# Patient Record
Sex: Female | Born: 1970 | Race: Asian | Hispanic: No | Smoking: Never smoker
Health system: Southern US, Community
[De-identification: ages and names within clinical notes are randomized; demographics above are authoritative.]

## PROBLEM LIST (undated history)

## (undated) ENCOUNTER — Ambulatory Visit: Admission: EM | Payer: No Typology Code available for payment source | Source: Home / Self Care

## (undated) ENCOUNTER — Inpatient Hospital Stay (HOSPITAL_COMMUNITY): Payer: Self-pay

## (undated) DIAGNOSIS — D649 Anemia, unspecified: Secondary | ICD-10-CM

## (undated) DIAGNOSIS — B181 Chronic viral hepatitis B without delta-agent: Secondary | ICD-10-CM

## (undated) DIAGNOSIS — E119 Type 2 diabetes mellitus without complications: Secondary | ICD-10-CM

## (undated) DIAGNOSIS — O24419 Gestational diabetes mellitus in pregnancy, unspecified control: Secondary | ICD-10-CM

## (undated) DIAGNOSIS — I10 Essential (primary) hypertension: Secondary | ICD-10-CM

## (undated) HISTORY — DX: Gestational diabetes mellitus in pregnancy, unspecified control: O24.419

## (undated) HISTORY — DX: Essential (primary) hypertension: I10

## (undated) HISTORY — PX: BRAIN SURGERY: SHX531

## (undated) HISTORY — DX: Chronic viral hepatitis B without delta-agent: B18.1

## (undated) HISTORY — DX: Type 2 diabetes mellitus without complications: E11.9

---

## 1998-03-01 ENCOUNTER — Ambulatory Visit (HOSPITAL_COMMUNITY): Admission: RE | Admit: 1998-03-01 | Discharge: 1998-03-01 | Payer: Self-pay | Admitting: Family Medicine

## 1998-03-20 ENCOUNTER — Encounter: Admission: RE | Admit: 1998-03-20 | Discharge: 1998-03-20 | Payer: Self-pay | Admitting: Sports Medicine

## 1998-04-03 ENCOUNTER — Encounter: Admission: RE | Admit: 1998-04-03 | Discharge: 1998-04-03 | Payer: Self-pay | Admitting: Family Medicine

## 1998-04-11 ENCOUNTER — Encounter: Admission: RE | Admit: 1998-04-11 | Discharge: 1998-04-11 | Payer: Self-pay | Admitting: Sports Medicine

## 1998-04-23 ENCOUNTER — Encounter: Admission: RE | Admit: 1998-04-23 | Discharge: 1998-04-23 | Payer: Self-pay | Admitting: Family Medicine

## 1998-04-30 ENCOUNTER — Encounter: Admission: RE | Admit: 1998-04-30 | Discharge: 1998-04-30 | Payer: Self-pay | Admitting: Family Medicine

## 1998-05-08 ENCOUNTER — Encounter: Admission: RE | Admit: 1998-05-08 | Discharge: 1998-05-08 | Payer: Self-pay | Admitting: Family Medicine

## 1998-05-13 ENCOUNTER — Encounter (HOSPITAL_COMMUNITY): Admission: RE | Admit: 1998-05-13 | Discharge: 1998-05-16 | Payer: Self-pay | Admitting: Obstetrics & Gynecology

## 1998-05-14 ENCOUNTER — Encounter: Admission: RE | Admit: 1998-05-14 | Discharge: 1998-05-14 | Payer: Self-pay | Admitting: Family Medicine

## 1998-05-16 ENCOUNTER — Inpatient Hospital Stay (HOSPITAL_COMMUNITY): Admission: AD | Admit: 1998-05-16 | Discharge: 1998-05-18 | Payer: Self-pay | Admitting: Obstetrics & Gynecology

## 1998-05-20 ENCOUNTER — Encounter: Admission: RE | Admit: 1998-05-20 | Discharge: 1998-05-20 | Payer: Self-pay | Admitting: Family Medicine

## 1998-06-25 ENCOUNTER — Encounter: Admission: RE | Admit: 1998-06-25 | Discharge: 1998-06-25 | Payer: Self-pay | Admitting: Family Medicine

## 1998-06-27 ENCOUNTER — Ambulatory Visit (HOSPITAL_COMMUNITY): Admission: RE | Admit: 1998-06-27 | Discharge: 1998-06-27 | Payer: Self-pay | Admitting: Obstetrics

## 1998-07-24 ENCOUNTER — Encounter: Admission: RE | Admit: 1998-07-24 | Discharge: 1998-07-24 | Payer: Self-pay | Admitting: Family Medicine

## 1998-07-24 ENCOUNTER — Other Ambulatory Visit: Admission: RE | Admit: 1998-07-24 | Discharge: 1998-07-24 | Payer: Self-pay | Admitting: Family Medicine

## 1998-08-07 ENCOUNTER — Encounter: Admission: RE | Admit: 1998-08-07 | Discharge: 1998-08-07 | Payer: Self-pay | Admitting: Family Medicine

## 1998-08-12 ENCOUNTER — Encounter: Admission: RE | Admit: 1998-08-12 | Discharge: 1998-08-12 | Payer: Self-pay | Admitting: Family Medicine

## 2001-12-01 ENCOUNTER — Ambulatory Visit (HOSPITAL_COMMUNITY): Admission: RE | Admit: 2001-12-01 | Discharge: 2001-12-01 | Payer: Self-pay | Admitting: *Deleted

## 2002-03-02 ENCOUNTER — Ambulatory Visit (HOSPITAL_COMMUNITY): Admission: RE | Admit: 2002-03-02 | Discharge: 2002-03-02 | Payer: Self-pay | Admitting: *Deleted

## 2002-03-16 ENCOUNTER — Encounter (HOSPITAL_COMMUNITY): Admission: AD | Admit: 2002-03-16 | Discharge: 2002-04-15 | Payer: Self-pay | Admitting: *Deleted

## 2002-04-28 ENCOUNTER — Inpatient Hospital Stay (HOSPITAL_COMMUNITY): Admission: AD | Admit: 2002-04-28 | Discharge: 2002-05-01 | Payer: Self-pay | Admitting: *Deleted

## 2002-04-28 ENCOUNTER — Encounter (HOSPITAL_COMMUNITY): Admission: RE | Admit: 2002-04-28 | Discharge: 2002-04-29 | Payer: Self-pay | Admitting: *Deleted

## 2003-08-02 ENCOUNTER — Other Ambulatory Visit: Admission: RE | Admit: 2003-08-02 | Discharge: 2003-08-02 | Payer: Self-pay | Admitting: Obstetrics and Gynecology

## 2004-04-21 ENCOUNTER — Other Ambulatory Visit: Admission: RE | Admit: 2004-04-21 | Discharge: 2004-04-21 | Payer: Self-pay | Admitting: Obstetrics and Gynecology

## 2005-09-16 ENCOUNTER — Other Ambulatory Visit: Admission: RE | Admit: 2005-09-16 | Discharge: 2005-09-16 | Payer: Self-pay | Admitting: Obstetrics and Gynecology

## 2005-12-05 ENCOUNTER — Emergency Department (HOSPITAL_COMMUNITY): Admission: EM | Admit: 2005-12-05 | Discharge: 2005-12-06 | Payer: Self-pay | Admitting: Emergency Medicine

## 2006-05-31 ENCOUNTER — Ambulatory Visit: Payer: Self-pay | Admitting: Internal Medicine

## 2006-06-02 ENCOUNTER — Ambulatory Visit: Payer: Self-pay | Admitting: Family Medicine

## 2009-07-17 ENCOUNTER — Inpatient Hospital Stay (HOSPITAL_COMMUNITY): Admission: AD | Admit: 2009-07-17 | Discharge: 2009-07-17 | Payer: Self-pay | Admitting: Obstetrics & Gynecology

## 2009-07-18 ENCOUNTER — Inpatient Hospital Stay (HOSPITAL_COMMUNITY): Admission: RE | Admit: 2009-07-18 | Discharge: 2009-07-18 | Payer: Self-pay | Admitting: Family Medicine

## 2009-12-25 ENCOUNTER — Emergency Department (HOSPITAL_COMMUNITY): Admission: EM | Admit: 2009-12-25 | Discharge: 2009-12-25 | Payer: Self-pay | Admitting: Emergency Medicine

## 2010-01-24 ENCOUNTER — Ambulatory Visit (HOSPITAL_COMMUNITY): Admission: RE | Admit: 2010-01-24 | Discharge: 2010-01-24 | Payer: Self-pay | Admitting: Internal Medicine

## 2010-03-31 ENCOUNTER — Encounter: Admission: RE | Admit: 2010-03-31 | Discharge: 2010-03-31 | Payer: Self-pay

## 2010-09-19 ENCOUNTER — Emergency Department (HOSPITAL_COMMUNITY): Admission: EM | Admit: 2010-09-19 | Discharge: 2010-09-19 | Payer: Self-pay | Admitting: Emergency Medicine

## 2010-10-13 ENCOUNTER — Encounter: Admission: RE | Admit: 2010-10-13 | Discharge: 2010-10-13 | Payer: Self-pay | Admitting: Obstetrics and Gynecology

## 2010-10-30 ENCOUNTER — Encounter: Admission: RE | Admit: 2010-10-30 | Discharge: 2010-10-30 | Payer: Self-pay | Admitting: Geriatric Medicine

## 2011-03-01 LAB — URINE MICROSCOPIC-ADD ON

## 2011-03-01 LAB — COMPREHENSIVE METABOLIC PANEL
ALT: 14 U/L (ref 0–35)
AST: 18 U/L (ref 0–37)
Alkaline Phosphatase: 58 U/L (ref 39–117)
CO2: 27 mEq/L (ref 19–32)
Calcium: 8.5 mg/dL (ref 8.4–10.5)
Chloride: 106 mEq/L (ref 96–112)
GFR calc Af Amer: >60 mL/min (ref >60)
GFR calc non Af Amer: >60 mL/min (ref >60)
Glucose, Bld: 137 mg/dL — ABNORMAL HIGH (ref 70–99)
Potassium: 4 mEq/L (ref 3.5–5.1)
Sodium: 137 mEq/L (ref 135–145)
Total Bilirubin: 0.2 mg/dL — ABNORMAL LOW (ref 0.3–1.2)

## 2011-03-01 LAB — DIFFERENTIAL
Basophils Absolute: 0.1 10*3/uL (ref 0.0–0.1)
Eosinophils Absolute: 0.1 10*3/uL (ref 0.0–0.7)
Eosinophils Relative: 2 % (ref 0–5)
Lymphs Abs: 1.7 10*3/uL (ref 0.7–4.0)
Monocytes Absolute: 0.4 10*3/uL (ref 0.1–1.0)
Neutrophils Relative %: 57 % (ref 43–77)

## 2011-03-01 LAB — CBC
Hemoglobin: 10.9 g/dL — ABNORMAL LOW (ref 12.0–15.0)
MCHC: 31.6 g/dL (ref 30.0–36.0)
RBC: 5.83 MIL/uL — ABNORMAL HIGH (ref 3.87–5.11)
WBC: 5.4 10*3/uL (ref 4.0–10.5)

## 2011-03-01 LAB — URINALYSIS, ROUTINE W REFLEX MICROSCOPIC
Bilirubin Urine: NEGATIVE
Ketones, ur: NEGATIVE mg/dL
Nitrite: NEGATIVE
Specific Gravity, Urine: 1.026 (ref 1.005–1.030)
Urobilinogen, UA: 0.2 mg/dL (ref 0.0–1.0)

## 2011-03-01 LAB — POCT PREGNANCY, URINE: Preg Test, Ur: NEGATIVE

## 2011-03-01 LAB — LIPASE, BLOOD: Lipase: 27 U/L (ref 11–59)

## 2011-03-21 LAB — POCT PREGNANCY, URINE: Preg Test, Ur: NEGATIVE

## 2011-05-18 ENCOUNTER — Other Ambulatory Visit (HOSPITAL_COMMUNITY): Payer: Self-pay | Admitting: Gastroenterology

## 2011-05-18 DIAGNOSIS — R1011 Right upper quadrant pain: Secondary | ICD-10-CM

## 2011-05-25 ENCOUNTER — Encounter (HOSPITAL_COMMUNITY)
Admission: RE | Admit: 2011-05-25 | Discharge: 2011-05-25 | Disposition: A | Discharge status: Home / Self Care | Payer: Medicaid Other | Source: Ambulatory Visit | Attending: Gastroenterology | Admitting: Gastroenterology

## 2011-05-25 DIAGNOSIS — R1011 Right upper quadrant pain: Secondary | ICD-10-CM

## 2011-05-25 MED ORDER — TECHNETIUM TC 99M MEBROFENIN IV KIT
5.0000 | PACK | Freq: Once | INTRAVENOUS | Status: AC | PRN
  Administered 2011-05-25: 5 via INTRAVENOUS

## 2011-06-10 ENCOUNTER — Other Ambulatory Visit: Payer: Self-pay | Admitting: Gastroenterology

## 2011-06-10 DIAGNOSIS — R1011 Right upper quadrant pain: Secondary | ICD-10-CM

## 2011-06-15 ENCOUNTER — Ambulatory Visit
Admission: RE | Admit: 2011-06-15 | Discharge: 2011-06-15 | Disposition: A | Discharge status: Home / Self Care | Payer: Medicaid Other | Source: Ambulatory Visit | Attending: Gastroenterology | Admitting: Gastroenterology

## 2011-06-15 DIAGNOSIS — R1011 Right upper quadrant pain: Secondary | ICD-10-CM

## 2011-06-15 MED ORDER — IOHEXOL 300 MG/ML  SOLN: 100.0000 mL | Freq: Once | INTRAMUSCULAR | Status: AC | PRN

## 2011-06-22 ENCOUNTER — Ambulatory Visit
Admission: RE | Admit: 2011-06-22 | Discharge: 2011-06-22 | Disposition: A | Discharge status: Home / Self Care | Payer: Medicaid Other | Source: Ambulatory Visit | Attending: Gastroenterology | Admitting: Gastroenterology

## 2011-06-22 DIAGNOSIS — R1011 Right upper quadrant pain: Secondary | ICD-10-CM

## 2011-08-26 ENCOUNTER — Emergency Department (HOSPITAL_COMMUNITY)
Admission: EM | Admit: 2011-08-26 | Discharge: 2011-08-26 | Discharge status: Against Medical Advice | Payer: Medicaid Other | Attending: Emergency Medicine | Admitting: Emergency Medicine

## 2012-10-10 ENCOUNTER — Encounter (HOSPITAL_COMMUNITY): Payer: Self-pay

## 2012-10-10 ENCOUNTER — Inpatient Hospital Stay (HOSPITAL_COMMUNITY)
Admission: AD | Admit: 2012-10-10 | Discharge: 2012-10-10 | Disposition: A | Discharge status: Home / Self Care | Payer: Medicaid Other | Source: Ambulatory Visit | Attending: Obstetrics and Gynecology | Admitting: Obstetrics and Gynecology

## 2012-10-10 ENCOUNTER — Inpatient Hospital Stay (HOSPITAL_COMMUNITY): Payer: Medicaid Other

## 2012-10-10 DIAGNOSIS — O039 Complete or unspecified spontaneous abortion without complication: Secondary | ICD-10-CM

## 2012-10-10 DIAGNOSIS — D649 Anemia, unspecified: Secondary | ICD-10-CM

## 2012-10-10 LAB — CBC
MCH: 16.9 pg — ABNORMAL LOW (ref 26.0–34.0)
MCHC: 29.8 g/dL — ABNORMAL LOW (ref 30.0–36.0)
Platelets: 159 10*3/uL (ref 150–400)
RBC: 5.86 MIL/uL — ABNORMAL HIGH (ref 3.87–5.11)

## 2012-10-10 MED ORDER — FERROUS SULFATE 325 (65 FE) MG PO TABS: 325.0000 mg | ORAL_TABLET | Freq: Two times a day (BID) | ORAL | Status: DC

## 2012-10-10 MED ORDER — OXYCODONE-ACETAMINOPHEN 5-325 MG PO TABS: 1.0000 | ORAL_TABLET | Freq: Four times a day (QID) | ORAL | Status: DC | PRN

## 2012-10-10 NOTE — MAU Note (Signed)
Patient states she ws seen at [facility] on 10-15 and states the baby did not have a heart beat. States she wanted to wait and let the miscarriage happen on its own. States on 10-25 she passed tissue and has been bleeding and having pain since 10-26 and continues to bleed and have pain.

## 2012-10-10 NOTE — MAU Provider Note (Signed)
History     CSN: [id number]  Arrival date and time: 10/10/12 1155   First Provider Initiated Contact with Patient 10/10/12 1242      Chief Complaint  Patient presents with  . Vaginal Bleeding  . Abdominal Pain   HPI [name] is a 41yo G5P5000 female with a GA of [redacted]w[redacted]d who is currently experiencing a spontaneous abortion and presents today for vaginal bleeding. She sees Dr. Jackelyn Ali for her prenatal care and was diagnosed with a spontaneous abortion last week by ultrasound evaluation. Patient wished for expectant management and did not take any medications. She reports having passed blood and "clots" on Friday. She is still bleeding, she feels it is heavy bleeding. Unable to quantify amount; she uses a "bucket" that she sits on at home to catch all the blood.  Patient complains of weakness, lightheadedness and dizziness upon standing, lower abdominal cramping, vaginal bleeding, decreased appetite, and mild occasional nausea. Denies fever, chills, vomiting, chest pain, palpitations, or shortness of breath. Patient reports RUQ abdominal pain that has been present for 2 months (during her pregnancy). She states that the abdominal pain has historically been aggravated by food but has been less painful the past 2 weeks.  Patient called Dr. Berenda Ali office early this morning and was able to get an appointment for this afternoon. Patient's daughter is driving her and has to work this afternoon, so they came to MAU rather than waiting for afternoon appointment at Dr. Berenda Ali office.   History reviewed. No pertinent past medical history.  History reviewed. No pertinent past surgical history.  History reviewed. No pertinent family history.  History  Substance Use Topics  . Smoking status: Never Smoker   . Smokeless tobacco: Not on file  . Alcohol Use:     Allergies: No Known Allergies  Prescriptions prior to admission  Medication Sig Dispense Refill  . ibuprofen  (ADVIL,MOTRIN) 200 MG tablet Take 200 mg by mouth every 6 (six) hours as needed. pain        Review of Systems  Constitutional: Positive for malaise/fatigue. Negative for fever and chills.  Eyes: Negative for blurred vision and double vision.  Respiratory: Negative for shortness of breath.   Cardiovascular: Negative for chest pain and palpitations.  Gastrointestinal: Positive for nausea and abdominal pain. Negative for vomiting, diarrhea and constipation.  Genitourinary: Negative for dysuria, urgency and frequency.  Neurological: Positive for dizziness and weakness.   Physical Exam   Blood pressure 101/67, pulse 69, temperature 98.4 F (36.9 C), temperature source Oral, resp. rate 16, height 5\' 2"  (1.575 m), weight 73.846 kg (162 lb 12.8 oz), last menstrual period 07/28/2012, SpO2 100.00%.  Physical Exam  Constitutional: She appears well-developed and well-nourished. She appears distressed (mild distress).  HENT:  Head: Normocephalic and atraumatic.  Neck: Neck supple. No thyromegaly present.  Cardiovascular: Normal rate, regular rhythm and normal heart sounds.   Respiratory: Effort normal and breath sounds normal. No respiratory distress.  GI: Soft. Bowel sounds are normal. She exhibits no distension and no mass. There is tenderness (tenderness to deep palpation in RUQ. No tenderness to light or deep palpation elsewhere). There is no rebound and no guarding.  Genitourinary: There is no tenderness or lesion on the right labia. There is no tenderness or lesion on the left labia. Uterus is tender (mildly). Uterus is not enlarged. Cervix exhibits no friability. Right adnexum displays no mass, no tenderness and no fullness. Left adnexum displays no mass, no tenderness and no fullness. There is bleeding (  mod amount bright red bleeding with 3 clots.) around the vagina.       Pelvic exam by Jeani Sow, RN FNP  Lymphadenopathy:    She has no cervical adenopathy.  Skin: Skin is warm and dry.    Results for orders placed during the hospital encounter of 10/10/12 (from the past 24 hour(s))  CBC     Status: Abnormal   Collection Time   10/10/12 12:40 PM      Component Value Range   WBC 4.5  4.0 - 10.5 K/uL   RBC 5.86 (*) 3.87 - 5.11 MIL/uL   Hemoglobin 9.9 (*) 12.0 - 15.0 g/dL   HCT 16.1 (*) 09.6 - 04.5 %   MCV 56.7 (*) 78.0 - 100.0 fL   MCH 16.9 (*) 26.0 - 34.0 pg   MCHC 29.8 (*) 30.0 - 36.0 g/dL   RDW 40.9 (*) 81.1 - 91.4 %   Platelets 159  150 - 400 K/uL  HCG, QUANTITATIVE, PREGNANCY     Status: Abnormal   Collection Time   10/10/12 12:40 PM      Component Value Range   hCG, Beta Chain, Quant, S 1537 (*) <5 mIU/mL  ABO/RH     Status: Normal (Preliminary result)   Collection Time   10/10/12 12:40 PM      Component Value Range   ABO/RH(D) A POS     Clinical Data: Recent first trimester spontaneous abortion.  Vaginal bleeding. Negative pregnancy test. Evaluate for retained  products of conception.  TRANSABDOMINAL AND TRANSVAGINAL ULTRASOUND OF PELVIS  Technique: Both transabdominal and transvaginal ultrasound  examinations of the pelvis were performed. Transabdominal  technique was performed for global imaging of the pelvis including  uterus, ovaries, adnexal regions, and pelvic cul-de-sac.  It was necessary to proceed with endovaginal exam following the  transabdominal exam to visualize the the endometrium and ovaries.  Comparison: None.  Findings:  Uterus: 10.1 x 5.6 x 6.0 cm. A heterogeneous submucosal mass is  seen at the anterior endometrial - myometrial junction which has  irregular margins and increased blood flow on color Doppler  ultrasound. This measures approximately 2.0 by 1.7 x 1.1 cm. This  does not have typical findings of a submucosal fibroid, and is  suspicious for retained products of conception.  Endometrium: Heterogeneous appearance. Double layer thickness  measures 19 mm.  Right ovary: 3.7 x 2.2 x 2.6 cm. Normal appearance.  Left ovary:  2.6 x 0.9 x 2.5 cm. Normal appearance.  Other Findings: No free fluid  IMPRESSION:  1. 2 cm hypervascular mass at the anterior endometrial -  myometrial junction in the uterine corpus. This is suspicious for  retained products of conception, with submucosal fibroid considered  less likely.  2. Normal appearance of both ovaries. No adnexal mass or free  fluid identified.  Original Report Authenticated By: Danae Orleans, M.D.    MAU Course  Procedures  MDM 14:50  Reported Labs, Ultrasound and Physical exam findings to Dr. Senaida Ores.  Instructed to talk to patient re: treatment options and let her know what she wants to do.   I discussed with the patient--explained expected management, to use Cytotec that she has Rx for at home, or D&E that could be done today.  The patient, through the interpreter, wants to let the miscarriage continue to happen without intervention.  Discussed a second time and patient wants the same.  She understands to call the office if sxs worsen or if she changes her mind about treatment.  She is to call and make appt for 1 week.  Dr. Senaida Ores informed of patient's discussion  ABORH not found in office or here will draw before she leaves.  Patient, may be discharged per Dr. Senaida Ores   Patient is asking for something for home for the pain.  Ibuprofen not alleviating the cramps.  Assessment and Plan  A:  Spontaneous Abortion      Anemia P:  Report worsening sxs to her doctor      To take Rx for  ferrous sulfate tablet bid daily until see in office      Follow up in the office in 1 week instructed to call office and schedule appt for repeat lab work     Rx for Percocet 5/325mg  tabs #20 Matt Holmes 10/10/2012, 3:59 PM   I have reviewed the students PHI and agree with her findings--patient was originally assigned to [employer], which is why the student began care of patient.

## 2012-10-11 LAB — GC/CHLAMYDIA PROBE AMP, GENITAL
Chlamydia, DNA Probe: NEGATIVE
GC Probe Amp, Genital: NEGATIVE

## 2013-04-25 ENCOUNTER — Emergency Department (HOSPITAL_COMMUNITY): Payer: Medicaid Other

## 2013-04-25 ENCOUNTER — Emergency Department (HOSPITAL_COMMUNITY)
Admission: EM | Admit: 2013-04-25 | Discharge: 2013-04-25 | Disposition: A | Discharge status: Home / Self Care | Payer: Medicaid Other | Attending: Emergency Medicine | Admitting: Emergency Medicine

## 2013-04-25 ENCOUNTER — Encounter (HOSPITAL_COMMUNITY): Payer: Self-pay | Admitting: *Deleted

## 2013-04-25 DIAGNOSIS — Y929 Unspecified place or not applicable: Secondary | ICD-10-CM | POA: Insufficient documentation

## 2013-04-25 DIAGNOSIS — M62838 Other muscle spasm: Secondary | ICD-10-CM

## 2013-04-25 DIAGNOSIS — X58XXXA Exposure to other specified factors, initial encounter: Secondary | ICD-10-CM | POA: Insufficient documentation

## 2013-04-25 DIAGNOSIS — Z79899 Other long term (current) drug therapy: Secondary | ICD-10-CM | POA: Insufficient documentation

## 2013-04-25 DIAGNOSIS — S161XXA Strain of muscle, fascia and tendon at neck level, initial encounter: Secondary | ICD-10-CM

## 2013-04-25 DIAGNOSIS — Y939 Activity, unspecified: Secondary | ICD-10-CM | POA: Insufficient documentation

## 2013-04-25 DIAGNOSIS — S139XXA Sprain of joints and ligaments of unspecified parts of neck, initial encounter: Secondary | ICD-10-CM | POA: Insufficient documentation

## 2013-04-25 MED ORDER — CYCLOBENZAPRINE HCL 10 MG PO TABS: 10.0000 mg | ORAL_TABLET | Freq: Two times a day (BID) | ORAL | Status: DC | PRN

## 2013-04-25 MED ORDER — KETOROLAC TROMETHAMINE 30 MG/ML IJ SOLN: 30.0000 mg | Freq: Once | INTRAMUSCULAR | Status: DC

## 2013-04-25 MED ORDER — ORPHENADRINE CITRATE 30 MG/ML IJ SOLN: 60.0000 mg | Freq: Two times a day (BID) | INTRAMUSCULAR | Status: DC

## 2013-04-25 MED ORDER — DIPHENHYDRAMINE HCL 25 MG PO CAPS: 25.0000 mg | ORAL_CAPSULE | Freq: Once | ORAL | Status: DC

## 2013-04-25 MED ORDER — KETOROLAC TROMETHAMINE 30 MG/ML IJ SOLN
30.0000 mg | Freq: Once | INTRAMUSCULAR | Status: AC
  Administered 2013-04-25: 30 mg via INTRAMUSCULAR
  Filled 2013-04-25: qty 1

## 2013-04-25 MED ORDER — KETOROLAC TROMETHAMINE 15 MG/ML IJ SOLN
30.0000 mg | Freq: Once | INTRAMUSCULAR | Status: DC
  Filled 2013-04-25: qty 2

## 2013-04-25 MED ORDER — IBUPROFEN 400 MG PO TABS: 400.0000 mg | ORAL_TABLET | Freq: Four times a day (QID) | ORAL | Status: DC | PRN

## 2013-04-25 MED ORDER — METHOCARBAMOL 100 MG/ML IJ SOLN
500.0000 mg | Freq: Once | INTRAMUSCULAR | Status: DC
  Filled 2013-04-25: qty 5

## 2013-04-25 MED ORDER — CYCLOBENZAPRINE HCL 10 MG PO TABS
5.0000 mg | ORAL_TABLET | Freq: Once | ORAL | Status: AC
  Administered 2013-04-25: 5 mg via ORAL
  Filled 2013-04-25: qty 1

## 2013-04-25 NOTE — ED Provider Notes (Signed)
History     CSN: [id number]  Arrival date & time 04/25/13  2725   First MD Initiated Contact with Patient 04/25/13 [phone]      Chief Complaint  Patient presents with  . Neck Pain    (Consider location/radiation/quality/duration/timing/severity/associated sxs/prior treatment) HPI Comments: 42 y.o. female who presents to the Er w/ the cc of neck pain. Neck pain started more than 3 months ago. No trauma to the neck. She has left sided neck pain that radiates up towards her head, it is not global in nature. Waxes and wanes. No fevers or chills or increased masses associated w/ this. No n/v associated w/ this. She is been given fioricet for this without resolution of symptoms. Has not tried NSAIDS or muscle relaxants.   Patient is a 42 y.o. female presenting with neck pain. The history is provided by the patient.  Neck Pain Pain location: left sided. Quality:  Cramping Radiates to: radiates to the back of her head  Pain severity:  Mild Pain is:  Worse during the day Onset quality:  Gradual Duration:  3 months Timing:  Constant Progression:  Unchanged Context: not fall   Relieved by:  Nothing Exacerbated by: tried fioricet. Associated symptoms: no chest pain, no fever, no headaches and no leg pain     History reviewed. No pertinent past medical history.  History reviewed. No pertinent past surgical history.  No family history on file.  History  Substance Use Topics  . Smoking status: Never Smoker   . Smokeless tobacco: Not on file  . Alcohol Use:     OB History   Grav Para Term Preterm Abortions TAB SAB Ect Mult Living   6 5 5              Review of Systems  Constitutional: Negative for fever, chills and fatigue.  HENT: Positive for neck pain. Negative for facial swelling, drooling and dental problem.   Eyes: Negative for pain, discharge and itching.  Respiratory: Negative for cough, choking, wheezing and stridor.   Cardiovascular: Negative for chest pain.   Gastrointestinal: Negative for vomiting, abdominal pain and diarrhea.  Endocrine: Negative for cold intolerance and heat intolerance.  Genitourinary: Negative for vaginal discharge, difficulty urinating and vaginal pain.  Skin: Negative for pallor and rash.  Neurological: Negative for dizziness, light-headedness and headaches.  Psychiatric/Behavioral: Negative for behavioral problems and agitation.    Allergies  Review of patient's allergies indicates no known allergies.  Home Medications   Current Outpatient Rx  Name  Route  Sig  Dispense  Refill  . ferrous sulfate (FERROUSUL) 325 (65 FE) MG tablet   Oral   Take 1 tablet (325 mg total) by mouth 2 (two) times daily.   60 tablet   3   . ibuprofen (ADVIL,MOTRIN) 200 MG tablet   Oral   Take 200 mg by mouth every 6 (six) hours as needed. pain         . oxyCODONE-acetaminophen (PERCOCET/ROXICET) 5-325 MG per tablet   Oral   Take 1-2 tablets by mouth every 6 (six) hours as needed for pain.   20 tablet   0     LMP 07/14/2012  Breastfeeding? Unknown  Physical Exam  Constitutional: She is oriented to person, place, and time. She appears well-developed. No distress.  HENT:  Head: Normocephalic and atraumatic.  Mouth/Throat: Oropharynx is clear and moist. No oropharyngeal exudate.  Left neck paraspinal ttp. Her upper trapezius muscle has ttp. No increased mass or swelling in neck. Able  to flex and extend neck without issues. Able to move neck right to left completely.   Eyes: Pupils are equal, round, and reactive to light. Right eye exhibits no discharge. Left eye exhibits no discharge.  Neck: Neck supple. No tracheal deviation present.  Cardiovascular: Normal rate.  Exam reveals no gallop and no friction rub.   Pulmonary/Chest: No stridor. No respiratory distress. She has no wheezes.  Abdominal: Soft. She exhibits no distension. There is no tenderness. There is no rebound.  Musculoskeletal: She exhibits no edema and no  tenderness.  Neurological: She is alert and oriented to person, place, and time. No cranial nerve deficit.  Pt is able to easily ambulate in the room. Heel to toe is intact. Normal gait.   Skin: Skin is warm. She is not diaphoretic.    ED Course  Procedures (including critical care time)  Labs Reviewed - No data to display Dg Cervical Spine 2-3 Views  04/25/2013  *RADIOLOGY REPORT*  Clinical Data: Posterior and left side neck pain.  CERVICAL SPINE - 2-3 VIEW  Comparison: Plain film cervical spine 01/24/2010 and MRI cervical spine 03/31/2010.  Findings: Vertebral body height and alignment are normal. Intervertebral disc space height is maintained.  Prevertebral soft tissues appear normal.  Lung apices are clear.  IMPRESSION: Negative examination.   Original Report Authenticated By: Holley Dexter, M.D.       MDM   Pt has no headache -- do not suspect SAH / migraines, or other acute intracranial pathology. Pt's symptoms are consistent w/ left sided neck muscle strain / spasm. She is not febrile -- doubt infectious etiology. xrays do not show acute pathology. No signs of meningitis -- no nuchal rigidity, no fevers -- symptoms present now for over 3 months.   Has not been tried on NSAIDs or muscle relaxants -- will try these two, and have told pt to f/u with pcp for re-evaluation, which she states she can do.    1. Neck muscle spasm   2. Neck strain, initial encounter            Bernadene Person, MD 04/25/13 1127

## 2013-04-25 NOTE — ED Provider Notes (Signed)
I have supervised the resident on the management of this patient and agree with the note above. I personally interviewed and examined the patient and my addendum is below.   Tammy Ali is a 42 y.o. female here with several months of L neck pain. Worse with movement. Tried fioricet without improvement. On exam, + paracervical tenderness, no midline tenderness. No falls or trauma. Patient wants imaging so xray done that was nl. Neuro exam unremarkable. Will d/c home on motrin and flexeril.    Richardean Canal, MD 04/25/13 1153

## 2013-04-25 NOTE — ED Notes (Signed)
Pt reports neck pain x 2-3 months. States pain worse with movement. Denies known injury. Given pain medication before but states it did not help. Pain radiates from neck into head.

## 2013-08-02 ENCOUNTER — Other Ambulatory Visit (HOSPITAL_COMMUNITY): Payer: Self-pay | Admitting: Urology

## 2013-08-02 DIAGNOSIS — R3129 Other microscopic hematuria: Secondary | ICD-10-CM

## 2013-08-07 ENCOUNTER — Ambulatory Visit (HOSPITAL_COMMUNITY): Payer: Self-pay

## 2014-10-15 ENCOUNTER — Encounter (HOSPITAL_COMMUNITY): Payer: Self-pay | Admitting: *Deleted

## 2015-09-26 ENCOUNTER — Encounter (HOSPITAL_COMMUNITY): Payer: Self-pay | Admitting: General Practice

## 2015-09-26 ENCOUNTER — Emergency Department (HOSPITAL_COMMUNITY)
Admission: EM | Admit: 2015-09-26 | Discharge: 2015-09-26 | Disposition: A | Discharge status: Home / Self Care | Payer: BLUE CROSS/BLUE SHIELD | Attending: Emergency Medicine | Admitting: Emergency Medicine

## 2015-09-26 DIAGNOSIS — Z3202 Encounter for pregnancy test, result negative: Secondary | ICD-10-CM | POA: Insufficient documentation

## 2015-09-26 DIAGNOSIS — Z862 Personal history of diseases of the blood and blood-forming organs and certain disorders involving the immune mechanism: Secondary | ICD-10-CM | POA: Diagnosis not present

## 2015-09-26 DIAGNOSIS — Z79899 Other long term (current) drug therapy: Secondary | ICD-10-CM | POA: Diagnosis not present

## 2015-09-26 DIAGNOSIS — R51 Headache: Secondary | ICD-10-CM | POA: Diagnosis present

## 2015-09-26 DIAGNOSIS — G43809 Other migraine, not intractable, without status migrainosus: Secondary | ICD-10-CM | POA: Diagnosis not present

## 2015-09-26 HISTORY — DX: Anemia, unspecified: D64.9

## 2015-09-26 LAB — POC URINE PREG, ED: Preg Test, Ur: NEGATIVE

## 2015-09-26 MED ORDER — KETOROLAC TROMETHAMINE 30 MG/ML IJ SOLN
30.0000 mg | Freq: Once | INTRAMUSCULAR | Status: AC
  Administered 2015-09-26: 30 mg via INTRAVENOUS
  Filled 2015-09-26: qty 1

## 2015-09-26 MED ORDER — MAGNESIUM SULFATE 2 GM/50ML IV SOLN
2.0000 g | Freq: Once | INTRAVENOUS | Status: AC
  Administered 2015-09-26: 2 g via INTRAVENOUS
  Filled 2015-09-26: qty 50

## 2015-09-26 MED ORDER — METOCLOPRAMIDE HCL 5 MG/ML IJ SOLN
10.0000 mg | Freq: Once | INTRAMUSCULAR | Status: AC
  Administered 2015-09-26: 10 mg via INTRAVENOUS
  Filled 2015-09-26: qty 2

## 2015-09-26 MED ORDER — DIPHENHYDRAMINE HCL 50 MG/ML IJ SOLN
25.0000 mg | Freq: Once | INTRAMUSCULAR | Status: AC
  Administered 2015-09-26: 25 mg via INTRAVENOUS
  Filled 2015-09-26: qty 1

## 2015-09-26 NOTE — Discharge Instructions (Signed)
Follow-up with your doctor as needed. Return to ED for worsening symptoms. ?au n?a ??u. (Migraine Headache) ?au n?a ??u l m?t c?n ?au nhi v nhi?u ? m?t bn ??u c?a qu v?. M?t c?n ?au n?a ??u c th? ko di t? 30 pht ??n vi ti?ng. NGUYN NHN.  Nguyn nhn chnh xc c?a ?au n?a ??u khng ph?i lc no c?ng xc ??nh ???c. Tuy nhin, ?au n?a ??u c th? pht sinh khi cc dy th?n kinh trong no b? kch thch v gi?i phng ra cc ha ch?t gy vim. Hi?n t??ng ny gy ra ?au. M?t s? v?n ?? khc c?ng c th? gy ra ?au n?a ??u, ch?ng h?n:  R??u.  Ht thu?c l.  C?ng th?ng.  Kinh nguy?t.  Pho mt ?? lu.  Th?c ?n ho?c ?? u?ng c ch?a nitrat, glutamate, aspartame, ho?c tyramine.  Thi?u ng?.  S c la.  Caffeine.  ?i.  G?ng s?c.  M?t m?i.  Thu?c dng ?? ?i?u tr? ?au ng?c (nitroglycerine), vin thu?c trnh [place], estrogen, v m?t s? thu?c ?i?u tr? huy?t p. D?U HI?U V TRI?U CH?NG  ?au ? m?t bn ho?c c? hai bn ??u.  ?au t?ng c?n ho?c ?au nhi.  ?au nhi?u lm c?n tr? cc ho?t ??ng hng ngy.  C?n ?au k?ch pht khi c b?t k? ho?t ??ng th? ch?t no.  Bu?n nn, nn m?a, ho?c c? hai.  Chng m?t.  ?au khi ti?p xc v?i nh sng chi, ti?ng ?n l?n, ho?c ho?t ??ng.  Nh?y c?m ton thn v?i nh sng chi, ti?ng ?n l?n, ho?c mi. Tr??c khi qu v? b? ?au n?a ??u, qu v? c th? c nh?ng d?u hi?u c?nh bo s?p c c?n ?au n?a ??u (ti?n tri?u). M?t ti?n tri?u c th? bao g?m:  Nhn th?y nh sng lo ln.  Nhn th?y nh?ng ?i?m sng, qu?ng sng, ho?c cc ???ng ngo?n ngoo.  C th? tr??ng hnh ?ng ho?c nhn m?.  C c?m gic t b ho?c ?au bu?t.  Ni kh.  B? y?u c?. CH?N ?ON  ?au n?a ??u th??ng ???c ch?n ?on d?a vo:  Cc tri?u ch?ng.  Khm th?c th?.  Ch?p CT ho?c MRI ??u qu v?. Cc ki?m tra b?ng hnh ?nh ny khng th? ch?n ?on ???c ?au n?a ??u, nh?ng chng c th? gip lo?i tr? nh?ng nguyn nhn gy ?au ??u khc. ?I?U TR? C th? cho dng thu?c gi?m ?au v ch?ng bu?n  nn. C?ng c th? cho dng thu?c ?? ng?n ng?a ti di?n ?au n?a ??u.  H??NG D?N CH?M [place] T?I NH  Ch? s? d?ng thu?c khng c?n k ??n ho?c thu?c c?n k ??n ?? gi?m ?au ho?c gi?m c?m gic kh ch?u theo ch? d?n c?a chuyn gia ch?m [place] s?c kh?e c?a qu v?. Khng nn dng thu?c m gy nghi?n ko di.  N?m trong m?t phng t?i, yn t?nh khi qu v? b? ?au n?a ??u.  Ghi nh?t k hng ngy ?? tm ra ?i?u g c th? gy cc c?n ?au n?a ??u. Ch?ng h?n, hy ghi ra:  Qu v? ?n v u?ng g.  Qu v? ? ng? bao lu.  B?t k? thay ??i no trong ch? ?? ?n ho?c thu?c men.  H?n ch? s? d?ng r??u.  B? thu?c l, n?u qu v? ht thu?c.  Ng? 7 - 9 ti?ng, ho?c theo khuy?n ngh? c?a chuyn gia ch?m [place] s?c kh?e.  H?n ch? c?ng th?ng.  Gi? cho nh sng d?u nh? n?u nh  sng m?nh lm qu v? kh ch?u v lm ch?ng ?au n?a ??u t?i t? h?n. NGAY L?P T?C ?I KHM N?U:   C?n ?au n?a ??u c?a qu v? n?ng h?n.  Qu v? b? s?t.  Qu v? b? c?ng c?.  Qu v? b? m?t th? l?c.  Qu v? b? y?u c? ho?c m?t ki?m sot c?.  Qu v? b?t ??u m?t th?ng b?ng ho?c ?i l?i kh kh?n.  Qu v? c?m th?y mu?n ng?t ho?c ng?t.  Qu v? c nh?ng tri?u ch?ng n?ng khc v?i nh?ng tri?u ch?ng ban ??u. ??M B?O QU V?:   Hi?u r cc h??ng d?n ny.  S? theo di tnh tr?ng c?a mnh.  S? yu c?u tr? gip ngay l?p t?c n?u qu v? c?m th?y khng kh?e ho?c th?y tr?m tr?ng h?n.   Thng tin ny khng nh?m m?c ?ch thay th? cho l?i khuyn m chuyn gia ch?m [place] s?c kh?e ni v?i qu v?. Hy b?o ??m qu v? ph?i th?o lu?n b?t k? v?n ?? g m qu v? c v?i chuyn gia ch?m [place] s?c kh?e c?a qu v?.   Document Released: 11/30/2005 Document Revised: 09/20/2013 Elsevier Interactive Patient Education [employer]

## 2015-09-26 NOTE — ED Notes (Signed)
Pt presents to the ED with complaints of an intermittent migraine for 2 weeks. Pt reports pain is worse at night. Migraine is located on pts left lateral head. Pt is A/O. Pt denies vomiting, but reports feeling nauseated. Pain is worse with movement. Pt describes pain as a throbbing pounding pain.

## 2015-09-26 NOTE — ED Provider Notes (Signed)
CSN: [id number]     Arrival date & time 09/26/15  [id number] History   First MD Initiated Contact with Patient 09/26/15 0751     Chief Complaint  Patient presents with  . Migraine     (Consider location/radiation/quality/duration/timing/severity/associated sxs/prior Treatment) HPI Tammy Ali is a 44 y.o. female with a history of migraines comes in for evaluation of acute migraine. There is a liquid barrier, daughter is at bedside and translates. For the past 2 weeks, patient has had intermittent migraines, similar to her previous migraines. She has been taking ibuprofen without relief. She denies any fevers, chills, nausea or vomiting, neck pain or stiffness, rash. She reports a throbbing sensation to the left side of her head. Rates her discomfort as a 7/10. No recent travel or surgeries. Pain is worse with moving.  Past Medical History  Diagnosis Date  . Anemia    History reviewed. No pertinent past surgical history. No family history on file. Social History  Substance Use Topics  . Smoking status: Never Smoker   . Smokeless tobacco: None  . Alcohol Use: No   OB History    Gravida Para Term Preterm AB TAB SAB Ectopic Multiple Living   6 5 5             Review of Systems A 10 point review of systems was completed and was negative except for pertinent positives and negatives as mentioned in the history of present illness     Allergies  Review of patient's allergies indicates no known allergies.  Home Medications   Prior to Admission medications   Medication Sig Start Date End Date Taking? Authorizing Provider  butalbital-acetaminophen-caffeine (FIORICET WITH CODEINE) 50-325-40-30 MG per capsule Take 1 capsule by mouth every 4 (four) hours as needed for headache.    Historical Provider, MD  cyclobenzaprine (FLEXERIL) 10 MG tablet Take 1 tablet (10 mg total) by mouth 2 (two) times daily as needed for muscle spasms. Patient not taking: Reported on 09/26/2015 04/25/13   Iltifat Husain,  MD  ibuprofen (ADVIL,MOTRIN) 400 MG tablet Take 1 tablet (400 mg total) by mouth every 6 (six) hours as needed for pain. Patient not taking: Reported on 09/26/2015 04/25/13   Iltifat Husain, MD   BP 102/74 mmHg  Pulse 55  Temp(Src) 98 F (36.7 C) (Oral)  Resp 18  Ht 5\' 3"  (1.6 m)  Wt 145 lb (65.772 kg)  BMI 25.69 kg/m2  SpO2 100%  LMP 09/01/2015 (Exact Date) Physical Exam  Constitutional: She is oriented to person, place, and time. She appears well-developed and well-nourished.  HENT:  Head: Normocephalic and atraumatic.  Mouth/Throat: Oropharynx is clear and moist.  Eyes: Conjunctivae and EOM are normal. Pupils are equal, round, and reactive to light. Right eye exhibits no discharge. Left eye exhibits no discharge. No scleral icterus.  No nystagmus  Neck: Neck supple.  No meningismus or nuchal rigidity.  Cardiovascular: Normal rate, regular rhythm and normal heart sounds.   Pulmonary/Chest: Effort normal and breath sounds normal. No respiratory distress. She has no wheezes. She has no rales.  Abdominal: Soft. There is no tenderness.  Musculoskeletal: Normal range of motion. She exhibits no tenderness.  Neurological: She is alert and oriented to person, place, and time.  Cranial Nerves II-XII grossly intact. Motor strength is 5/5 in all 4 extremities. Sensation intact to light touch. Gait is baseline  Skin: Skin is warm and dry. No rash noted.  Psychiatric: She has a normal mood and affect.  Nursing note and vitals reviewed.  ED Course  Procedures (including critical care time) Labs Review Labs Reviewed  POC URINE PREG, ED    Imaging Review No results found. I have personally reviewed and evaluated these images and lab results as part of my medical decision-making.   EKG Interpretation None     Meds given in ED:  Medications  ketorolac (TORADOL) 30 MG/ML injection 30 mg (30 mg Intravenous Given 09/26/15 0853)  metoCLOPramide (REGLAN) injection 10 mg (10 mg  Intravenous Given 09/26/15 0852)  diphenhydrAMINE (BENADRYL) injection 25 mg (25 mg Intravenous Given 09/26/15 0850)  magnesium sulfate IVPB 2 g 50 mL (2 g Intravenous New Bag/Given 09/26/15 0854)    New Prescriptions   No medications on file   Filed Vitals:   09/26/15 0803 09/26/15 0822 09/26/15 0900 09/26/15 1000  BP: 124/67 120/73 120/79 102/74  Pulse: 72 50 53 55  Temp: 98.1 F (36.7 C) 98 F (36.7 C)    TempSrc: Oral Oral    Resp: 16 18    Height:   (1.6 m)    Weight:  145 lb (65.772 kg)    SpO2: 100% 93% 100% 100%    MDM  Vitals stable, afebrile Feels better after analgesia in ED. Normal neurological exam. No dizziness. Gait baseline. HA gradual in onset, progressively worsening. Similar to previous HA No hx of traumatic injury No hx of clotting disorder, peripartum or postpartum state, Immunocompromise Doubt Meningitis, CVA/SAH/ICH, Dural Venous Thrombosis, Eclampsia, Seizure, Central Lesion or Tumor, Giant Cell Arteritis, Cervical/Carotid or other vascular dissection. I have personally reviewed all labs, imaging, nursing/prvious notes during the patient's evaluation in the ED today. No evidence of other acute or emergent pathology that requires immediate intervention at this time. Pt stable, in good condition and is appropriate for discharge. DC with instructions to follow up with PCP within 48 hrs for further evaluation and management of symptoms.   Final diagnoses:  Other migraine without status migrainosus, not intractable       Joycie Peek, PA-C 09/26/15 1249  Donnetta Hutching, MD 09/26/15 1329

## 2015-11-06 LAB — OB RESULTS CONSOLE ABO/RH: RH Type: POSITIVE

## 2015-11-06 LAB — OB RESULTS CONSOLE RPR: RPR: NONREACTIVE

## 2015-11-06 LAB — OB RESULTS CONSOLE HEPATITIS B SURFACE ANTIGEN: Hepatitis B Surface Ag: POSITIVE

## 2015-11-06 LAB — OB RESULTS CONSOLE GC/CHLAMYDIA
CHLAMYDIA, DNA PROBE: NEGATIVE
Gonorrhea: NEGATIVE

## 2015-11-06 LAB — OB RESULTS CONSOLE RUBELLA ANTIBODY, IGM: RUBELLA: IMMUNE

## 2015-11-06 LAB — OB RESULTS CONSOLE HIV ANTIBODY (ROUTINE TESTING): HIV: NONREACTIVE

## 2015-11-06 LAB — OB RESULTS CONSOLE ANTIBODY SCREEN: ANTIBODY SCREEN: NEGATIVE

## 2015-12-15 NOTE — L&D Delivery Note (Signed)
Operative Delivery Note At 2:50 PM a viable female was delivered via Vaginal, Spontaneous Delivery.  Presentation: vertex; Position: Occiput,, Anterior; Station: +2.  Verbal consent: obtained from patient.  Risks and benefits discussed in detail.  Risks include, but are not limited to the risks of anesthesia, bleeding, infection, damage to maternal tissues, fetal cephalhematoma.  There is also the risk of inability to effect vaginal delivery of the head, or shoulder dystocia that cannot be resolved by established maneuvers, leading to the need for emergency cesarean section.  Offered vacuum assistance due to poor pushing effort and reduced FHR variability.  APGAR: 8, 9; weight pending.   Placenta status: spontaneous, intact.   Cord: 3 vessels with the following complications: None.  Anesthesia: None  Instruments: Kiwi Episiotomy: None Lacerations: 1st degree Suture Repair: none Est. Blood Loss (mL): 300  Mom to postpartum.  Baby to Couplet care / Skin to Skin.  Jaycub Noorani D 06/13/2016, 3:01 PM

## 2016-01-27 ENCOUNTER — Other Ambulatory Visit (HOSPITAL_COMMUNITY): Payer: Self-pay | Admitting: Obstetrics and Gynecology

## 2016-01-28 ENCOUNTER — Other Ambulatory Visit (HOSPITAL_COMMUNITY): Payer: Self-pay | Admitting: Obstetrics and Gynecology

## 2016-01-28 DIAGNOSIS — R1011 Right upper quadrant pain: Secondary | ICD-10-CM

## 2016-01-28 DIAGNOSIS — B181 Chronic viral hepatitis B without delta-agent: Secondary | ICD-10-CM

## 2016-02-04 ENCOUNTER — Ambulatory Visit (HOSPITAL_COMMUNITY)
Admission: RE | Admit: 2016-02-04 | Discharge: 2016-02-04 | Disposition: A | Discharge status: Home / Self Care | Payer: BLUE CROSS/BLUE SHIELD | Source: Ambulatory Visit | Attending: Obstetrics and Gynecology | Admitting: Obstetrics and Gynecology

## 2016-02-04 DIAGNOSIS — R1011 Right upper quadrant pain: Secondary | ICD-10-CM | POA: Insufficient documentation

## 2016-02-04 DIAGNOSIS — O26892 Other specified pregnancy related conditions, second trimester: Secondary | ICD-10-CM | POA: Insufficient documentation

## 2016-02-04 DIAGNOSIS — Z3A23 23 weeks gestation of pregnancy: Secondary | ICD-10-CM | POA: Diagnosis not present

## 2016-02-04 DIAGNOSIS — O98412 Viral hepatitis complicating pregnancy, second trimester: Secondary | ICD-10-CM | POA: Diagnosis not present

## 2016-02-04 DIAGNOSIS — B181 Chronic viral hepatitis B without delta-agent: Secondary | ICD-10-CM | POA: Insufficient documentation

## 2016-03-23 DIAGNOSIS — B181 Chronic viral hepatitis B without delta-agent: Secondary | ICD-10-CM | POA: Insufficient documentation

## 2016-04-07 ENCOUNTER — Encounter: Payer: Medicaid Other | Attending: Obstetrics and Gynecology | Admitting: *Deleted

## 2016-04-07 VITALS — Ht 62.5 in | Wt 175.9 lb

## 2016-04-07 DIAGNOSIS — Z029 Encounter for administrative examinations, unspecified: Secondary | ICD-10-CM | POA: Diagnosis present

## 2016-04-07 DIAGNOSIS — R7309 Other abnormal glucose: Secondary | ICD-10-CM

## 2016-04-17 ENCOUNTER — Encounter: Payer: Self-pay | Admitting: *Deleted

## 2016-04-17 NOTE — Progress Notes (Signed)
  Patient was seen on 04/07/16 for Gestational Diabetes self-management visit at the [facility]. The following learning objectives were met by the patient during this course:   States the definition of Gestational Diabetes  States why dietary management is important in controlling blood glucose  Describes the effects each nutrient has on blood glucose levels  Demonstrates ability to create a balanced meal plan  Demonstrates carbohydrate counting   States when to check blood glucose levels  Demonstrates proper blood glucose monitoring techniques  States the effect of stress and exercise on blood glucose levels  States the importance of limiting caffeine and abstaining from alcohol and smoking  Blood glucose monitor given: Accu Chek Nano Lot # [id number] Exp: 10/13/2016  Patient instructed to monitor glucose levels: FBS: 60 - <90 2 hour: <120  *Patient received handouts:  Nutrition Diabetes and Pregnancy  Carbohydrate Counting List  Patient will be seen for follow-up as needed.

## 2016-04-21 ENCOUNTER — Ambulatory Visit: Payer: BLUE CROSS/BLUE SHIELD | Admitting: Nutrition

## 2016-05-13 LAB — OB RESULTS CONSOLE GBS: GBS: NEGATIVE

## 2016-06-10 ENCOUNTER — Telehealth (HOSPITAL_COMMUNITY): Payer: Self-pay | Admitting: General Practice

## 2016-06-10 ENCOUNTER — Encounter (HOSPITAL_COMMUNITY): Payer: Self-pay | Admitting: General Practice

## 2016-06-10 NOTE — Telephone Encounter (Signed)
Preadmission screen  

## 2016-06-13 ENCOUNTER — Inpatient Hospital Stay (HOSPITAL_COMMUNITY)
Admission: AD | Admit: 2016-06-13 | Discharge: 2016-06-15 | DRG: 774 | Disposition: A | Discharge status: Home / Self Care | Payer: Medicaid Other | Source: Ambulatory Visit | Attending: Obstetrics and Gynecology | Admitting: Obstetrics and Gynecology

## 2016-06-13 ENCOUNTER — Encounter (HOSPITAL_COMMUNITY): Payer: Self-pay

## 2016-06-13 DIAGNOSIS — B181 Chronic viral hepatitis B without delta-agent: Secondary | ICD-10-CM | POA: Diagnosis present

## 2016-06-13 DIAGNOSIS — O24420 Gestational diabetes mellitus in childbirth, diet controlled: Secondary | ICD-10-CM | POA: Diagnosis present

## 2016-06-13 DIAGNOSIS — O9842 Viral hepatitis complicating childbirth: Secondary | ICD-10-CM | POA: Diagnosis present

## 2016-06-13 DIAGNOSIS — Z3A39 39 weeks gestation of pregnancy: Secondary | ICD-10-CM | POA: Diagnosis not present

## 2016-06-13 DIAGNOSIS — IMO0001 Reserved for inherently not codable concepts without codable children: Secondary | ICD-10-CM

## 2016-06-13 LAB — CBC
HCT: 35.7 % — ABNORMAL LOW (ref 36.0–46.0)
Hemoglobin: 11.3 g/dL — ABNORMAL LOW (ref 12.0–15.0)
MCH: 18.2 pg — AB (ref 26.0–34.0)
MCHC: 31.7 g/dL (ref 30.0–36.0)
MCV: 57.4 fL — ABNORMAL LOW (ref 78.0–100.0)
Platelets: 160 10*3/uL (ref 150–400)
RBC: 6.22 MIL/uL — ABNORMAL HIGH (ref 3.87–5.11)
RDW: 19.8 % — AB (ref 11.5–15.5)
WBC: 17.1 10*3/uL — ABNORMAL HIGH (ref 4.0–10.5)

## 2016-06-13 LAB — GLUCOSE, CAPILLARY
GLUCOSE-CAPILLARY: 88 mg/dL (ref 65–99)
Glucose-Capillary: 79 mg/dL (ref 65–99)
Glucose-Capillary: 83 mg/dL (ref 65–99)

## 2016-06-13 LAB — TYPE AND SCREEN
ABO/RH(D): A POS
ANTIBODY SCREEN: NEGATIVE

## 2016-06-13 MED ORDER — SENNOSIDES-DOCUSATE SODIUM 8.6-50 MG PO TABS
2.0000 | ORAL_TABLET | ORAL | Status: DC
  Administered 2016-06-13 – 2016-06-14 (×2): 2 via ORAL
  Filled 2016-06-13 (×2): qty 2

## 2016-06-13 MED ORDER — IBUPROFEN 600 MG PO TABS
600.0000 mg | ORAL_TABLET | Freq: Four times a day (QID) | ORAL | Status: DC
  Administered 2016-06-13 – 2016-06-15 (×8): 600 mg via ORAL
  Filled 2016-06-13 (×8): qty 1

## 2016-06-13 MED ORDER — COCONUT OIL OIL: 1.0000 "application " | TOPICAL_OIL | Status: DC | PRN

## 2016-06-13 MED ORDER — LIDOCAINE HCL (PF) 1 % IJ SOLN
30.0000 mL | INTRAMUSCULAR | Status: DC | PRN
Start: 2016-06-13 — End: 2016-06-13

## 2016-06-13 MED ORDER — DIPHENHYDRAMINE HCL 25 MG PO CAPS
25.0000 mg | ORAL_CAPSULE | Freq: Four times a day (QID) | ORAL | Status: DC | PRN
  Administered 2016-06-14: 25 mg via ORAL
  Filled 2016-06-13 (×2): qty 1

## 2016-06-13 MED ORDER — METHYLERGONOVINE MALEATE 0.2 MG PO TABS: 0.2000 mg | ORAL_TABLET | ORAL | Status: DC | PRN

## 2016-06-13 MED ORDER — DIBUCAINE 1 % RE OINT: 1.0000 "application " | TOPICAL_OINTMENT | RECTAL | Status: DC | PRN

## 2016-06-13 MED ORDER — ONDANSETRON HCL 4 MG PO TABS: 4.0000 mg | ORAL_TABLET | ORAL | Status: DC | PRN

## 2016-06-13 MED ORDER — OXYCODONE-ACETAMINOPHEN 5-325 MG PO TABS: 1.0000 | ORAL_TABLET | ORAL | Status: DC | PRN

## 2016-06-13 MED ORDER — ZOLPIDEM TARTRATE 5 MG PO TABS
5.0000 mg | ORAL_TABLET | Freq: Every evening | ORAL | Status: DC | PRN
Start: 2016-06-13 — End: 2016-06-15

## 2016-06-13 MED ORDER — METHYLERGONOVINE MALEATE 0.2 MG/ML IJ SOLN: 0.2000 mg | INTRAMUSCULAR | Status: DC | PRN

## 2016-06-13 MED ORDER — WITCH HAZEL-GLYCERIN EX PADS: 1.0000 "application " | MEDICATED_PAD | CUTANEOUS | Status: DC | PRN

## 2016-06-13 MED ORDER — OXYTOCIN 40 UNITS IN LACTATED RINGERS INFUSION - SIMPLE MED
2.5000 [IU]/h | INTRAVENOUS | Status: DC
  Filled 2016-06-13: qty 1000

## 2016-06-13 MED ORDER — LACTATED RINGERS IV SOLN: 500.0000 mL | INTRAVENOUS | Status: DC | PRN

## 2016-06-13 MED ORDER — MEASLES, MUMPS & RUBELLA VAC ~~LOC~~ INJ
0.5000 mL | INJECTION | Freq: Once | SUBCUTANEOUS | Status: DC
  Filled 2016-06-13: qty 0.5

## 2016-06-13 MED ORDER — ONDANSETRON HCL 4 MG/2ML IJ SOLN: 4.0000 mg | Freq: Four times a day (QID) | INTRAMUSCULAR | Status: DC | PRN

## 2016-06-13 MED ORDER — BUTORPHANOL TARTRATE 1 MG/ML IJ SOLN: 1.0000 mg | INTRAMUSCULAR | Status: DC | PRN

## 2016-06-13 MED ORDER — SOD CITRATE-CITRIC ACID 500-334 MG/5ML PO SOLN: 30.0000 mL | ORAL | Status: DC | PRN

## 2016-06-13 MED ORDER — SIMETHICONE 80 MG PO CHEW
80.0000 mg | CHEWABLE_TABLET | ORAL | Status: DC | PRN
Start: 2016-06-13 — End: 2016-06-15

## 2016-06-13 MED ORDER — ONDANSETRON HCL 4 MG/2ML IJ SOLN: 4.0000 mg | INTRAMUSCULAR | Status: DC | PRN

## 2016-06-13 MED ORDER — ACETAMINOPHEN 325 MG PO TABS: 650.0000 mg | ORAL_TABLET | ORAL | Status: DC | PRN

## 2016-06-13 MED ORDER — BENZOCAINE-MENTHOL 20-0.5 % EX AERO
1.0000 "application " | INHALATION_SPRAY | CUTANEOUS | Status: DC | PRN
  Administered 2016-06-13: 1 via TOPICAL
  Filled 2016-06-13: qty 56

## 2016-06-13 MED ORDER — OXYCODONE-ACETAMINOPHEN 5-325 MG PO TABS: 2.0000 | ORAL_TABLET | ORAL | Status: DC | PRN

## 2016-06-13 MED ORDER — OXYTOCIN BOLUS FROM INFUSION
500.0000 mL | INTRAVENOUS | Status: DC
  Administered 2016-06-13: 500 mL via INTRAVENOUS

## 2016-06-13 MED ORDER — PRENATAL MULTIVITAMIN CH
1.0000 | ORAL_TABLET | Freq: Every day | ORAL | Status: DC
  Administered 2016-06-14 – 2016-06-15 (×2): 1 via ORAL
  Filled 2016-06-13 (×2): qty 1

## 2016-06-13 MED ORDER — LACTATED RINGERS IV SOLN
INTRAVENOUS | Status: DC
  Administered 2016-06-13 (×2): via INTRAVENOUS

## 2016-06-13 MED ORDER — OXYCODONE HCL 5 MG PO TABS
10.0000 mg | ORAL_TABLET | ORAL | Status: DC | PRN
  Administered 2016-06-14 – 2016-06-15 (×3): 10 mg via ORAL
  Filled 2016-06-13 (×3): qty 2

## 2016-06-13 MED ORDER — MAGNESIUM HYDROXIDE 400 MG/5ML PO SUSP: 30.0000 mL | ORAL | Status: DC | PRN

## 2016-06-13 MED ORDER — OXYCODONE HCL 5 MG PO TABS
5.0000 mg | ORAL_TABLET | ORAL | Status: DC | PRN
  Administered 2016-06-13 – 2016-06-14 (×3): 5 mg via ORAL
  Filled 2016-06-13 (×3): qty 1

## 2016-06-13 MED ORDER — ACETAMINOPHEN 325 MG PO TABS
650.0000 mg | ORAL_TABLET | ORAL | Status: DC | PRN
Start: 2016-06-13 — End: 2016-06-15

## 2016-06-13 MED ORDER — TETANUS-DIPHTH-ACELL PERTUSSIS 5-2.5-18.5 LF-MCG/0.5 IM SUSP: 0.5000 mL | Freq: Once | INTRAMUSCULAR | Status: DC

## 2016-06-13 NOTE — Progress Notes (Signed)
MD will place admission orders.

## 2016-06-13 NOTE — Progress Notes (Signed)
Feeling ctx, involuntarily pushing, using nitrous oxide Afeb, VSS FHT- 150s, min-mod variability, + accels, rare variable VE-Rim/90/0, unable to reduce cervix, moderate bleeding Encouraged her not to push, will recheck at 1430 and see if she can push

## 2016-06-13 NOTE — H&P (Signed)
Tammy Ali is a 45 y.o. female, G7 P4115, EGA 39+ weeks with EDC 7-2 presenting for ctx.  In MAU, having regular ctx, VE 4 cm.  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  Maternal Medical History:  Reason for admission: Contractions.   Contractions: Frequency: regular.   Perceived severity is moderate.    Fetal activity: Perceived fetal activity is normal.    Prenatal Complications - Diabetes: gestational. Diabetes is managed by diet.      OB History    Gravida Para Term Preterm AB TAB SAB Ectopic Multiple Living   7 5 4 1 1  0 1 0 0 5     Past Medical History  Diagnosis Date  . Anemia   . GDM (gestational diabetes mellitus)   . Hepatitis B carrier    Past Surgical History  Procedure Laterality Date  . Brain surgery      ?crainiotomy ?shunt placement- for headaches   Family History: family history is not on file. Social History:  reports that she has never smoked. She has never used smokeless tobacco. She reports that she does not drink alcohol or use illicit drugs.   Prenatal Transfer Tool  Maternal Diabetes: Yes:  Diabetes Type:  Diet controlled Genetic Screening: Normal Maternal Ultrasounds/Referrals: Normal Fetal Ultrasounds or other Referrals:  None Maternal Substance Abuse:  No Significant Maternal Medications:  None Significant Maternal Lab Results:  Lab values include: Group B Strep negative Other Comments:  None  Review of Systems  Respiratory: Negative.   Cardiovascular: Negative.    AROM clear Dilation: 5 Effacement (%): 50 Station: -1 Exam by:: dr Jackelyn Knifemeisinger Blood pressure 116/78, pulse 92, temperature 98.8 F (37.1 C), temperature source Oral, resp. rate 18, height 5\' 3"  (1.6 m), weight 84.823 kg (187 lb), last menstrual period 09/01/2015, unknown if currently breastfeeding. Maternal Exam:  Uterine Assessment: Contraction strength is moderate.  Contraction frequency is regular.   Abdomen: Patient reports  no abdominal tenderness. Estimated fetal weight is 8 lbs.   Fetal presentation: vertex  Introitus: Normal vulva. Normal vagina.  Amniotic fluid character: clear.  Pelvis: adequate for delivery.   Cervix: Cervix evaluated by digital exam.     Fetal Exam Fetal Monitor Review: Mode: ultrasound.   Baseline rate: 140.  Variability: moderate (6-25 bpm).   Pattern: accelerations present and no decelerations.    Fetal State Assessment: Category I - tracings are normal.     Physical Exam  Vitals reviewed. Constitutional: She appears well-developed and well-nourished.  Cardiovascular: Normal rate, regular rhythm and normal heart sounds.   No murmur heard. Respiratory: Effort normal. No respiratory distress. She has no wheezes.  GI: Soft.    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  AROM done for augmentation, CBG 78.  Will monitor progress, anticipate SVD.     Horace Wishon D 06/13/2016, 8:49 AM

## 2016-06-13 NOTE — Anesthesia Pain Management Evaluation Note (Addendum)
  CRNA Pain Management Visit Note  Patient: Tammy Ali, 45 y.o., female  "Hello I am a member of the anesthesia team at [facility]. We have an anesthesia team available at all times to provide care throughout the hospital, including epidural management and anesthesia for C-section. I don't know your plan for the delivery whether it a natural birth, water birth, IV sedation, nitrous supplementation, doula or epidural, but we want to meet your pain goals."   1.Was your pain managed to your expectations on prior hospitalizations?   Yes  2.What is your expectation for pain management during this hospitalization?     natural  3.How can we help you reach that goal? Only assist if need be  Record the patient's initial score and the patient's pain goal.   Pain: 8  Pain Goal: 9-10  [facility] wants you to be able to say your pain was always managed very well.  [facility] HospitalWRINKLE,Tammy Ali 06/13/2016

## 2016-06-13 NOTE — Lactation Note (Signed)
This note was copied from a baby's chart. Lactation Consultation Note  Patient Name: Tammy Ali Today's Date: 06/13/2016 Reason for consult: Initial assessment Baby at 6 hr of life. Mom was worried because baby was crying and would not latch so she asked for formula. She bf her other 4 children and supplemented. Discussed baby behavior, feeding frequency, baby belly size, formula, pumping, voids, wt loss, breast changes, and nipple care. Demonstrated manual expression, colostrum noted bilaterally, spoon in room. She declined pumping at this time. Given lactation handouts. Aware of OP services and support group. She will call as needed.      Maternal Data Has patient been taught Hand Expression?: Yes Does the patient have breastfeeding experience prior to this delivery?: Yes  Feeding Feeding Type: Breast Fed  LATCH Score/Interventions Latch: Repeated attempts needed to sustain latch, nipple held in mouth throughout feeding, stimulation needed to elicit sucking reflex.  Audible Swallowing: A few with stimulation Intervention(s): Hand expression  Type of Nipple: Everted at rest and after stimulation  Comfort (Breast/Nipple): Soft / non-tender     Hold (Positioning): Assistance needed to correctly position infant at breast and maintain latch. Intervention(s): Support Pillows;Position options  LATCH Score: 7  Lactation Tools Discussed/Used WIC Program: No   Consult Status Consult Status: Follow-up Date: 06/14/16 Follow-up type: In-patient    Tammy Ali 06/13/2016, 9:09 PM

## 2016-06-13 NOTE — Progress Notes (Signed)
Feeling ctx Afeb, VSS FHT- 140-150, mod variability, + scalp stim, some variable decels, Cat II, ctx not tracing well VE-6/80/-1, vtx Continue to monitor progress, anticipate SVD

## 2016-06-13 NOTE — MAU Note (Signed)
Contractions since 0200. Some bloody show. Denies LOF

## 2016-06-14 LAB — RPR: RPR Ser Ql: NONREACTIVE

## 2016-06-14 NOTE — Progress Notes (Signed)
PPD #1 No problems, wants to go home tomorrow Afeb, VSS Fundus firm, NT at U-1 Continue routine postpartum care

## 2016-06-14 NOTE — Lactation Note (Signed)
This note was copied from a baby's chart. Lactation Consultation Note  Patient Name: Tammy Ali ZOXWR'UToday's Date: 06/14/2016 Reason for consult: Follow-up assessment Baby at 31 hr of life. Mom is reporting low milk supply and sore nipples. No skin break down noted. Encouraged mom to offer both breast for at least 10 minutes each then offer formula if baby is still hungry. She can easily express drops of colostrum bilaterally and has spoon in the room but she does not seem interested, she prefers formula. She is aware of lactation services and support group. She will call as needed.   Maternal Data    Feeding    LATCH Score/Interventions                      Lactation Tools Discussed/Used     Consult Status Consult Status: Follow-up Date: 06/15/16 Follow-up type: In-patient    [name] 06/14/2016, 10:01 PM

## 2016-06-15 MED ORDER — IBUPROFEN 600 MG PO TABS: 600.0000 mg | ORAL_TABLET | Freq: Four times a day (QID) | ORAL | Status: DC | PRN

## 2016-06-15 NOTE — Lactation Note (Signed)
This note was copied from a baby's chart. Lactation Consultation Note  Follow up visit made prior to discharge.  Mom states milk is now in and baby latches well.  Breasts are filling and milk easily hand expressed.  Discussed that now that milk is in formula not needed.  Manual pump given to mom with instructions on use and cleaning.  No questions at present.  Encouraged to call for concerns.  Patient Name: Girl Bennie DallasHbren Sutch UXLKG'MToday's Date: 06/15/2016     Maternal Data    Feeding    LATCH Score/Interventions                      Lactation Tools Discussed/Used     Consult Status      Huston FoleyMOULDEN, Varshini Arrants S 06/15/2016, 10:43 AM

## 2016-06-15 NOTE — Progress Notes (Signed)
Mom and baby stable and appropriate throughout the night.  Mom and dad needing minimal assistance. 

## 2016-06-15 NOTE — Progress Notes (Signed)
Post Partum Day 2 Subjective: no complaints, up ad lib, voiding, tolerating PO, + flatus and ready for discharge to home today. Bonding well with baby  Objective: Blood pressure 106/62, pulse 57, temperature 98.5 F (36.9 C), temperature source Oral, resp. rate 16, height 5\' 3"  (1.6 m), weight 187 lb (84.823 kg), last menstrual period 09/01/2015, SpO2 99 %, unknown if currently breastfeeding.  Physical Exam:  General: alert, cooperative and no distress Lochia: appropriate Uterine Fundus: firm Incision: n/a DVT Evaluation: No evidence of DVT seen on physical exam. No significant calf/ankle edema.   Recent Labs  06/13/16 0800  HGB 11.3*  HCT 35.7*    Assessment/Plan: Discharge home, Breastfeeding and Contraception undecided   LOS: 2 days   Tammy Ali 06/15/2016, 9:24 AM

## 2016-06-15 NOTE — Discharge Instructions (Signed)
Nothing in vagina for 6 weeks.  No sex, tampons, and douching.  Other instructions as in [facility] Discharge Booklet. °

## 2016-06-15 NOTE — Progress Notes (Signed)
Assumed care of mom and baby.  Baby in moms arms.  Dad at bedside. 

## 2016-06-15 NOTE — Discharge Summary (Addendum)
OB Discharge Summary     Patient Name: Tammy Ali DOB: [birth year] MRN: [id number]  Date of admission: 06/13/2016 Delivering MD: Jackelyn KnifeMEISINGER, TODD   Date of discharge: 06/15/2016  Admitting diagnosis: 40 LABOR BLOOD SHOWING ABOUT 2AM Intrauterine pregnancy: 6131w6d     Secondary diagnosis:  Active Problems:   Active labor at term  Additional problems: none     Discharge diagnosis: Term Pregnancy Delivered                                                                                                Post partum procedures:none  Augmentation: AROM  Complications: None; vacuum assisted  Hospital course:  Onset of Labor With Vaginal Delivery     45 y.o. yo [id number] at 8531w6d was admitted in Active Labor on 06/13/2016. Patient had an uncomplicated labor course as follows:  Membrane Rupture Time/Date: 8:39 AM ,06/13/2016   Intrapartum Procedures: Episiotomy: None [1]                                         Lacerations:  1st degree [2]  Patient had a delivery of a Viable infant; vacuum assisted 06/13/2016  Information for the patient's newborn:  Tammy Ali [[id number]][[id number]]  Delivery Method: Vaginal, Vacuum (Extractor) (Filed from Delivery Summary)    Pateint had an uncomplicated postpartum course.  She is ambulating, tolerating a regular diet, passing flatus, and urinating well. Patient is discharged home in stable condition on 06/15/2016.    Physical exam  Filed Vitals:   06/13/16 2300 06/14/16 0619 06/14/16 1700 06/15/16 0610  BP: 102/56 101/67 132/83 106/62  Pulse: 74 71 72 57  Temp: 98.4 F (36.9 C) 98.2 F (36.8 C) 99 F (37.2 C) 98.5 F (36.9 C)  TempSrc: Oral Oral Oral Oral  Resp: 18 18 18 16   Height:      Weight:      SpO2: 99%  99%    General: alert, cooperative and no distress Lochia: appropriate Uterine Fundus: firm Incision: N/A DVT Evaluation: No evidence of DVT seen on physical exam. No significant calf/ankle edema. Labs: Lab Results  Component Value Date   WBC 17.1* 06/13/2016   HGB 11.3* 06/13/2016   HCT 35.7* 06/13/2016   MCV 57.4* 06/13/2016   PLT 160 06/13/2016   CMP Latest Ref Rng 12/25/2009  Glucose 70 - 99 mg/dL 952(W137(H)  BUN 6 - 23 mg/dL 11  Creatinine 0.4 - 1.2 mg/dL 4.130.56  Sodium 244135 - 010145 mEq/L 137  Potassium 3.5 - 5.1 mEq/L 4.0  Chloride 96 - 112 mEq/L 106  CO2 19 - 32 mEq/L 27  Calcium 8.4 - 10.5 mg/dL 8.5  Total Protein 6.0 - 8.3 g/dL 6.6  Total Bilirubin 0.3 - 1.2 mg/dL 2.7(O0.2(L)  Alkaline Phos 39 - 117 U/L 58  AST 0 - 37 U/L 18  ALT 0 - 35 U/L 14    Discharge instruction: per After Visit Summary and "Baby and Me Booklet".  After visit meds:    Medication List  ASK your doctor about these medications        prenatal multivitamin Tabs tablet  Take 1 tablet by mouth daily.        Diet: routine diet  Activity: Advance as tolerated. Pelvic rest for 6 weeks.   Outpatient follow up:6 weeks Follow up Appt:Future Appointments Date Time Provider Department Center  06/17/2016 7:30 AM [facility] ROOM [facility] None   Follow up Visit:No Follow-up on file.  Postpartum contraception: Undecided; patch? explained not good option if breastfeeding. Will think about options  Newborn Data: Live born female  Birth Weight: 8 lb 1.5 oz (3670 g) APGAR: 8, 9  Baby Feeding: Bottle and Breast Disposition:home with mother   06/15/2016 Tammy Givenecilia Worema Banga, DO

## 2016-06-15 NOTE — Progress Notes (Signed)
[facility]

## 2016-06-17 ENCOUNTER — Inpatient Hospital Stay (HOSPITAL_COMMUNITY): Admission: RE | Admit: 2016-06-17 | Payer: BLUE CROSS/BLUE SHIELD | Source: Ambulatory Visit

## 2018-11-03 ENCOUNTER — Other Ambulatory Visit: Payer: Self-pay | Admitting: Physician Assistant

## 2018-11-03 DIAGNOSIS — R591 Generalized enlarged lymph nodes: Secondary | ICD-10-CM

## 2018-12-13 ENCOUNTER — Ambulatory Visit
Admission: RE | Admit: 2018-12-13 | Discharge: 2018-12-13 | Disposition: A | Discharge status: Home / Self Care | Payer: BLUE CROSS/BLUE SHIELD | Source: Ambulatory Visit | Attending: Physician Assistant | Admitting: Physician Assistant

## 2018-12-13 DIAGNOSIS — R591 Generalized enlarged lymph nodes: Secondary | ICD-10-CM

## 2018-12-14 ENCOUNTER — Other Ambulatory Visit: Payer: Self-pay

## 2018-12-14 ENCOUNTER — Encounter (HOSPITAL_COMMUNITY): Payer: Self-pay | Admitting: Emergency Medicine

## 2018-12-14 ENCOUNTER — Ambulatory Visit (HOSPITAL_COMMUNITY)
Admission: EM | Admit: 2018-12-14 | Discharge: 2018-12-14 | Disposition: A | Discharge status: Home / Self Care | Payer: BLUE CROSS/BLUE SHIELD

## 2018-12-14 DIAGNOSIS — J069 Acute upper respiratory infection, unspecified: Secondary | ICD-10-CM | POA: Insufficient documentation

## 2018-12-14 DIAGNOSIS — B9789 Other viral agents as the cause of diseases classified elsewhere: Secondary | ICD-10-CM | POA: Insufficient documentation

## 2018-12-14 MED ORDER — IBUPROFEN 400 MG PO TABS: 400.0000 mg | ORAL_TABLET | Freq: Four times a day (QID) | ORAL | 0 refills | Status: DC | PRN

## 2018-12-14 MED ORDER — BENZONATATE 100 MG PO CAPS: 100.0000 mg | ORAL_CAPSULE | Freq: Three times a day (TID) | ORAL | 0 refills | Status: DC

## 2018-12-14 MED ORDER — FLUTICASONE PROPIONATE 50 MCG/ACT NA SUSP: 2.0000 | Freq: Every day | NASAL | 0 refills | Status: DC

## 2018-12-14 MED ORDER — CETIRIZINE-PSEUDOEPHEDRINE ER 5-120 MG PO TB12: 1.0000 | ORAL_TABLET | Freq: Every day | ORAL | 0 refills | Status: DC

## 2018-12-14 NOTE — ED Provider Notes (Signed)
[facility]   [id number] 12/14/18 Arrival Time: 0802   CC: URI symptoms   SUBJECTIVE: History from: patient and family  Tammy Ali is a 48 y.o. female who presents with abrupt onset of nasal congestion, runny nose, sore throat, and persistent dry cough x 7 day.  Admits to flu exposure.  Has tried OTC medications with minimal relief.  Denies aggravating factors.  Complains of body aches and chills.  Denies fever, SOB, wheezing, chest pain, nausea, changes in bowel or bladder habits.    ROS: As per HPI.  Past Medical History:  Diagnosis Date  . Anemia   . GDM (gestational diabetes mellitus)   . Hepatitis B carrier [facility])    Past Surgical History:  Procedure Laterality Date  . BRAIN SURGERY     ?crainiotomy ?shunt placement- for headaches   No Known Allergies No current facility-administered medications on file prior to encounter.    Current Outpatient Medications on File Prior to Encounter  Medication Sig Dispense Refill  . Prenatal Vit-Fe Fumarate-FA (PRENATAL MULTIVITAMIN) TABS tablet Take 1 tablet by mouth daily.     Social History   Socioeconomic History  . Marital status: Single    Spouse name: Not on file  . Number of children: Not on file  . Years of education: Not on file  . Highest education level: Not on file  Occupational History  . Not on file  Social Needs  . Financial resource strain: Not on file  . Food insecurity:    Worry: Not on file    Inability: Not on file  . Transportation needs:    Medical: Not on file    Non-medical: Not on file  Tobacco Use  . Smoking status: Never Smoker  . Smokeless tobacco: Never Used  Substance and Sexual Activity  . Alcohol use: No  . Drug use: No  . Sexual activity: Yes    Birth control/protection: None  Lifestyle  . Physical activity:    Days per week: Not on file    Minutes per session: Not on file  . Stress: Not on file  Relationships  . Social connections:    Talks on phone: Not on file   Gets together: Not on file    Attends religious service: Not on file    Active member of club or organization: Not on file    Attends meetings of clubs or organizations: Not on file    Relationship status: Not on file  . Intimate partner violence:    Fear of current or ex partner: Not on file    Emotionally abused: Not on file    Physically abused: Not on file    Forced sexual activity: Not on file  Other Topics Concern  . Not on file  Social History Narrative  . Not on file   Family History  Adopted: Yes    OBJECTIVE:  Vitals:   12/14/18 0825  BP: 118/73  Pulse: 83  Resp: 18  Temp: 98.3 F (36.8 C)  TempSrc: Oral  SpO2: 98%     General appearance: alert; appears mildly fatigued, but nontoxic; speaking in full sentences and tolerating own secretions HEENT: NCAT; Ears: EACs clear, TMs pearly gray; Eyes: PERRL.  EOM grossly intact. Sinuses: nontender; Nose: nares patent with mild clear rhinorrhea, Throat: oropharynx clear, tonsils non erythematous or enlarged, uvula midline  Neck: supple without LAD Lungs: unlabored respirations, symmetrical air entry; cough: absent; no respiratory distress; CTAB Heart: regular rate and rhythm.  Radial pulses 2+  symmetrical bilaterally Skin: warm and dry Psychological: alert and cooperative; normal mood and affect  ASSESSMENT & PLAN:  1. Viral URI with cough     Meds ordered this encounter  Medications  . ibuprofen (ADVIL,MOTRIN) 400 MG tablet    Sig: Take 1 tablet (400 mg total) by mouth every 6 (six) hours as needed for fever, mild pain or moderate pain.    Dispense:  30 tablet    Refill:  0    Order Specific Question:   Supervising Provider    Answer:   Eustace Moore [[id number]]  . cetirizine-pseudoephedrine (ZYRTEC-D) 5-120 MG tablet    Sig: Take 1 tablet by mouth daily.    Dispense:  30 tablet    Refill:  0    Order Specific Question:   Supervising Provider    Answer:   Eustace Moore [[id number]]  . fluticasone  (FLONASE) 50 MCG/ACT nasal spray    Sig: Place 2 sprays into both nostrils daily.    Dispense:  16 g    Refill:  0    Order Specific Question:   Supervising Provider    Answer:   Eustace Moore [[id number]]  . benzonatate (TESSALON) 100 MG capsule    Sig: Take 1 capsule (100 mg total) by mouth every 8 (eight) hours.    Dispense:  21 capsule    Refill:  0    Order Specific Question:   Supervising Provider    Answer:   Eustace Moore [[id number]]    Get plenty of rest and push fluids Tessalon Perles prescribed for cough Zyrtec-D prescribed for nasal congestion, runny nose, and/or sore throat. DO NOT TAKE if breast feeding Flonase prescribed for nasal congestion and runny nose Use medications daily for symptom relief Ibuprofen prescribed for body aches and pain Follow up with PCP or with [facility] if symptoms persist Return or go to ER if you have any new or worsening symptoms fever, chills, nausea, vomiting, chest pain, cough, shortness of breath, wheezing, abdominal pain, changes in bowel or bladder habits, etc...  Reviewed expectations re: course of current medical issues. Questions answered. Outlined signs and symptoms indicating need for more acute intervention. Patient verbalized understanding. After Visit Summary given.         Rennis Harding, PA-C 12/14/18 [phone]

## 2018-12-14 NOTE — Discharge Instructions (Addendum)
Get plenty of rest and push fluids °Tessalon Perles prescribed for cough °Zyrtec-D prescribed for nasal congestion, runny nose, and/or sore throat. DO NOT TAKE if breast feeding °Flonase prescribed for nasal congestion and runny nose °Use medications daily for symptom relief °Ibuprofen prescribed for body aches and pain °Follow up with PCP or with [facility] if symptoms persist °Return or go to ER if you have any new or worsening symptoms fever, chills, nausea, vomiting, chest pain, cough, shortness of breath, wheezing, abdominal pain, changes in bowel or bladder habits, etc... °

## 2018-12-14 NOTE — ED Triage Notes (Signed)
Symptoms for a week.  Now symptoms are worse.  Has body aches, sore throat , cough.

## 2019-02-03 IMAGING — US US AXILLARY LEFT
1 series · 4 of 4 positions shown · non-contrast
Comparison: Previous exam(s).

CLINICAL DATA: 47-year-old female with bilateral axillary pain and
palpable abnormalities for 1 month.

EXAM:
DIGITAL DIAGNOSTIC BILATERAL MAMMOGRAM WITH CAD AND TOMO
ULTRASOUND BILATERAL BREAST

[Series 1: us axillary left · 0.07mm/px · 4 of 4 slices shown]
[im 1/4]
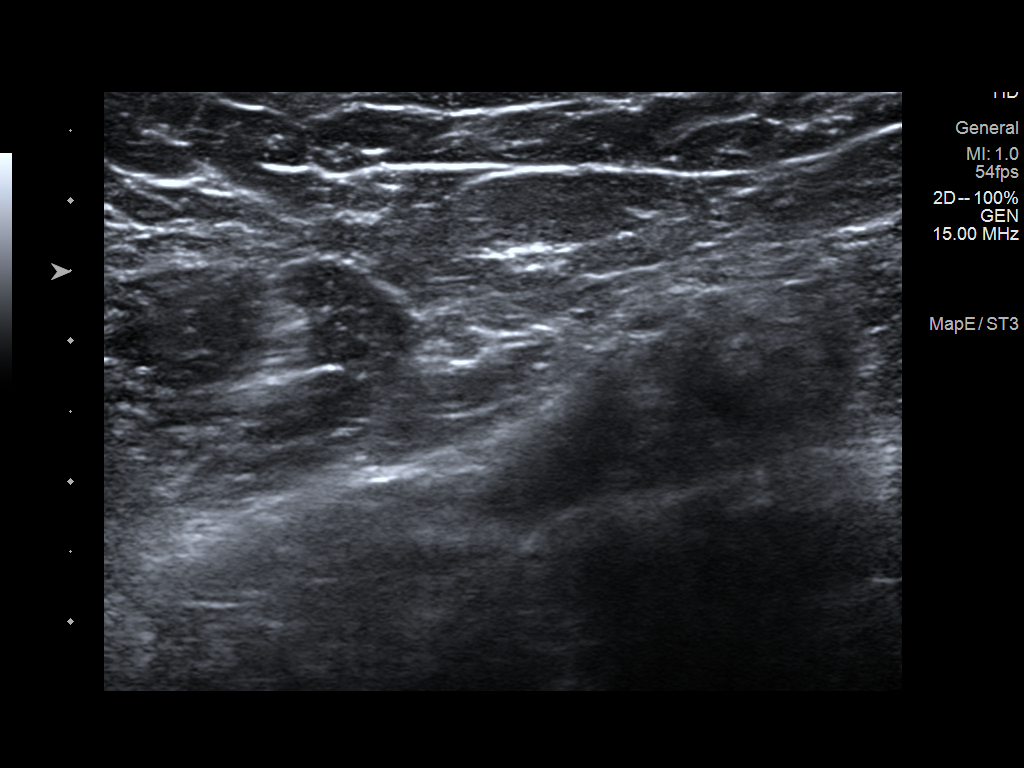
[im 2/4]
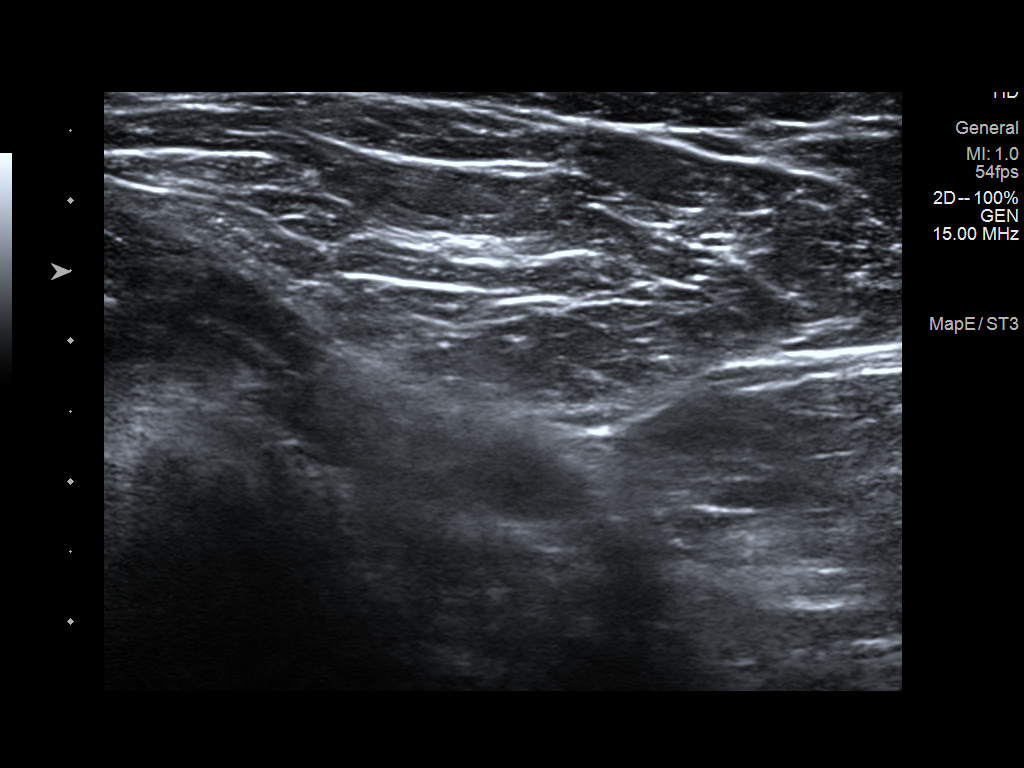
[im 3/4]
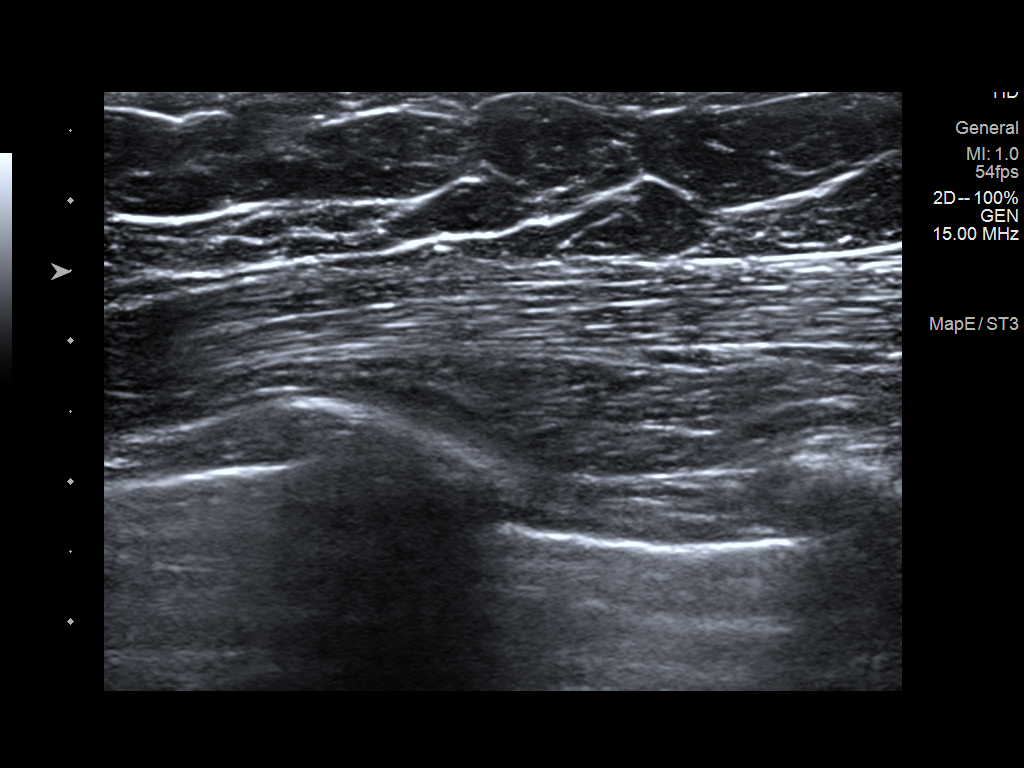
[im 4/4]
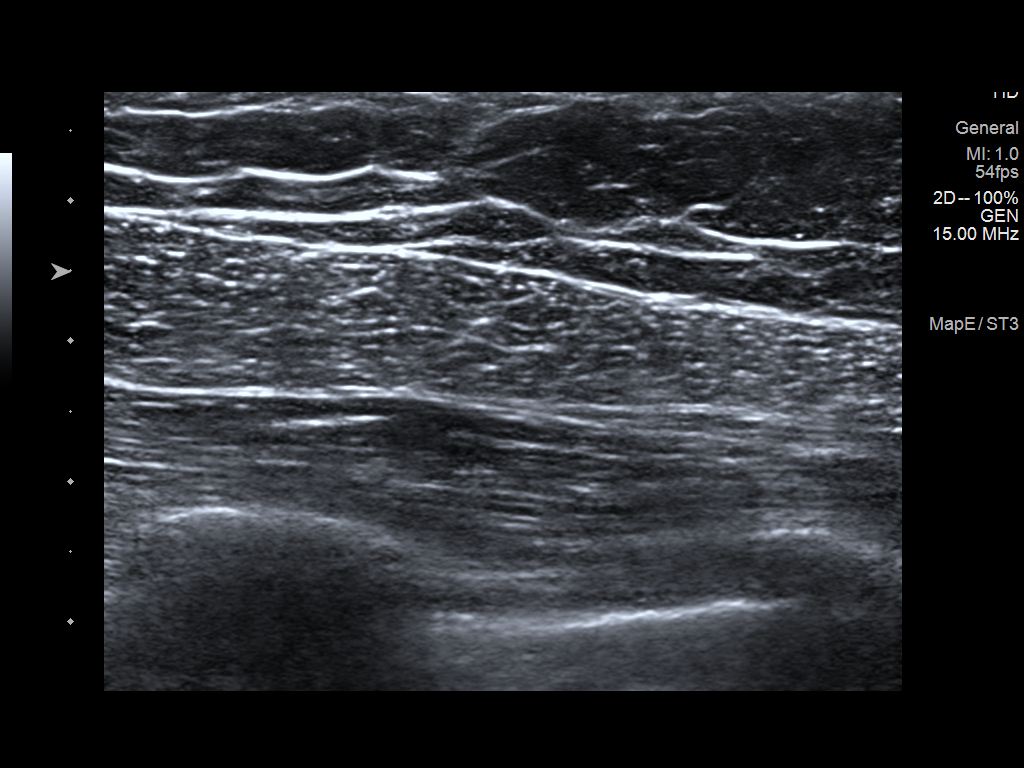

[4 of 4 positions shown; findings below may reference images not displayed]

ACR Breast Density Category c: The breast tissue is heterogeneously
dense, which may obscure small masses.
FINDINGS: No suspicious mammographic findings are identified in either breast.
The parenchymal pattern is stable. Partially visualized left
axillary lymph nodes appear mammographically normal.

Mammographic images were processed with CAD.

Targeted ultrasound is performed, showing normal fibroglandular
tissue without focal or suspicious sonographic abnormality.
Evaluation of the upper outer quadrants of the bilateral breasts was
performed. Evaluation of the axilla demonstrates morphologically
normal lymph nodes.
IMPRESSION: No suspicious mammographic or sonographic findings corresponding
with the patient's clinical symptoms.

RECOMMENDATION:
1. Clinical follow-up recommended for the symptomatic area of
concern in the bilateral breast. Any further workup should be based
on clinical grounds.
2.  Screening mammogram in one year.(Code:9Q-A-BM2)

I have discussed the findings and recommendations with the patient.
Results were also provided in writing at the conclusion of the
visit. If applicable, a reminder letter will be sent to the patient
regarding the next appointment.

BI-RADS CATEGORY  1: Negative.

## 2019-02-03 IMAGING — MG DIGITAL DIAGNOSTIC BILATERAL MAMMOGRAM WITH TOMO AND CAD
8 series · 8 of 24 positions shown · non-contrast
Comparison: Previous exam(s).

CLINICAL DATA: 47-year-old female with bilateral axillary pain and
palpable abnormalities for 1 month.

EXAM:
DIGITAL DIAGNOSTIC BILATERAL MAMMOGRAM WITH CAD AND TOMO
ULTRASOUND BILATERAL BREAST

[L MLO synth-2D]
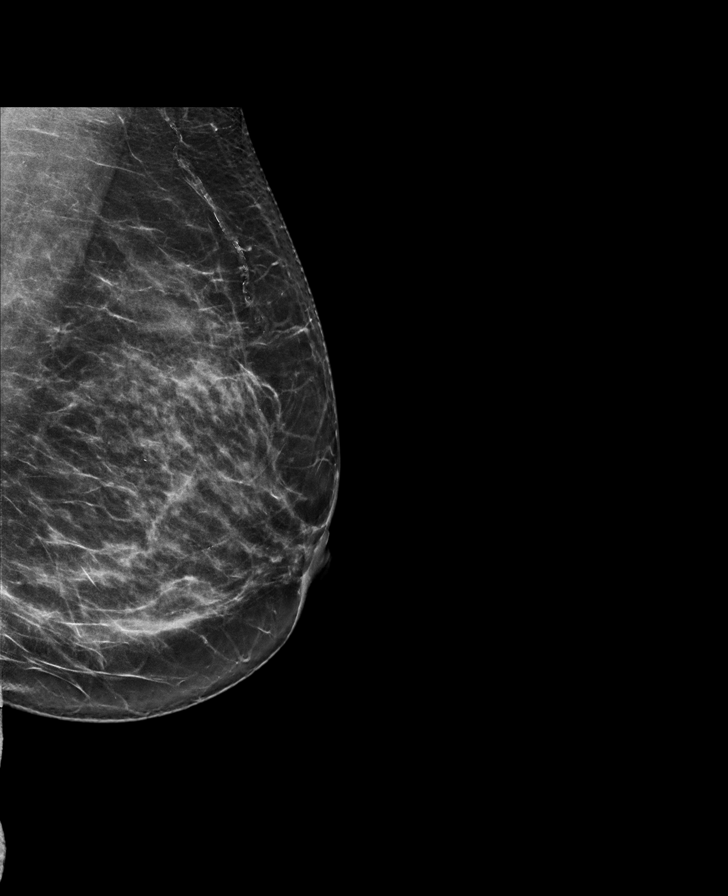

[R CC synth-2D]
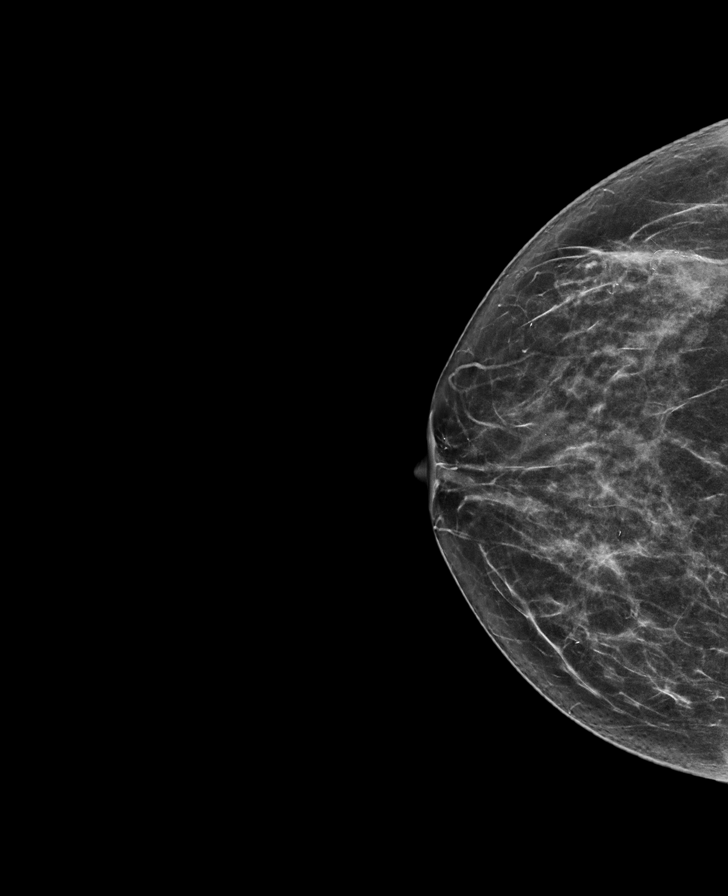

[R MLO synth-2D]
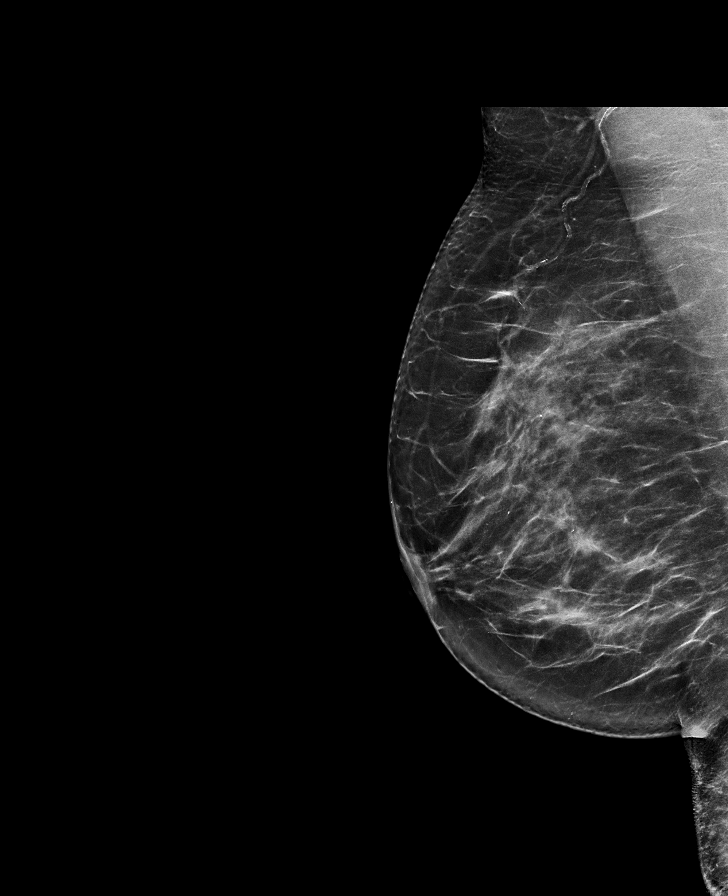

[L CC synth-2D]
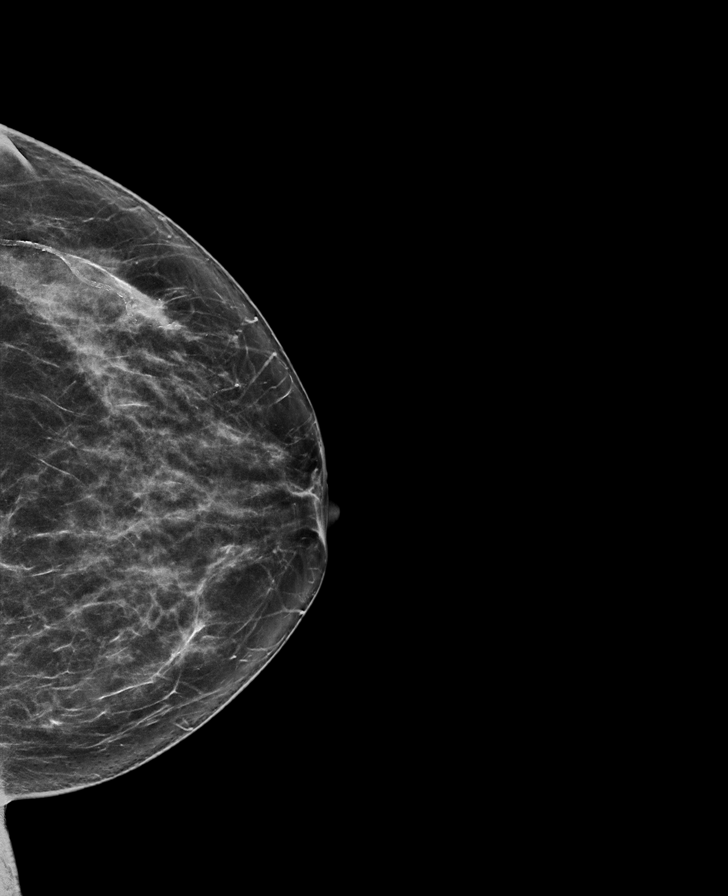

[R CC tomo · tomo slice 35/70.0]
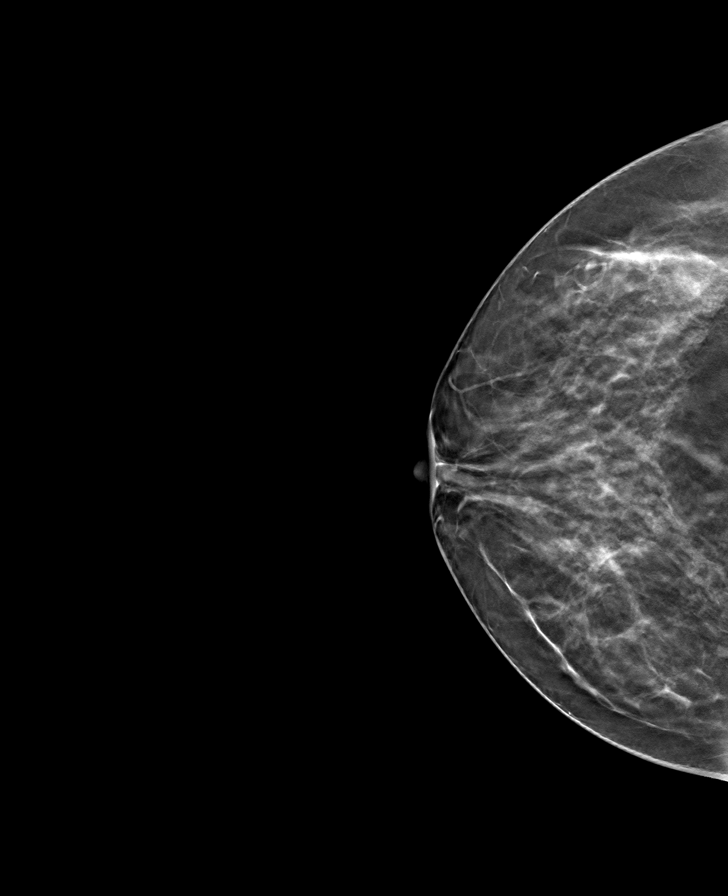

[L CC tomo · tomo slice 37/74.0]
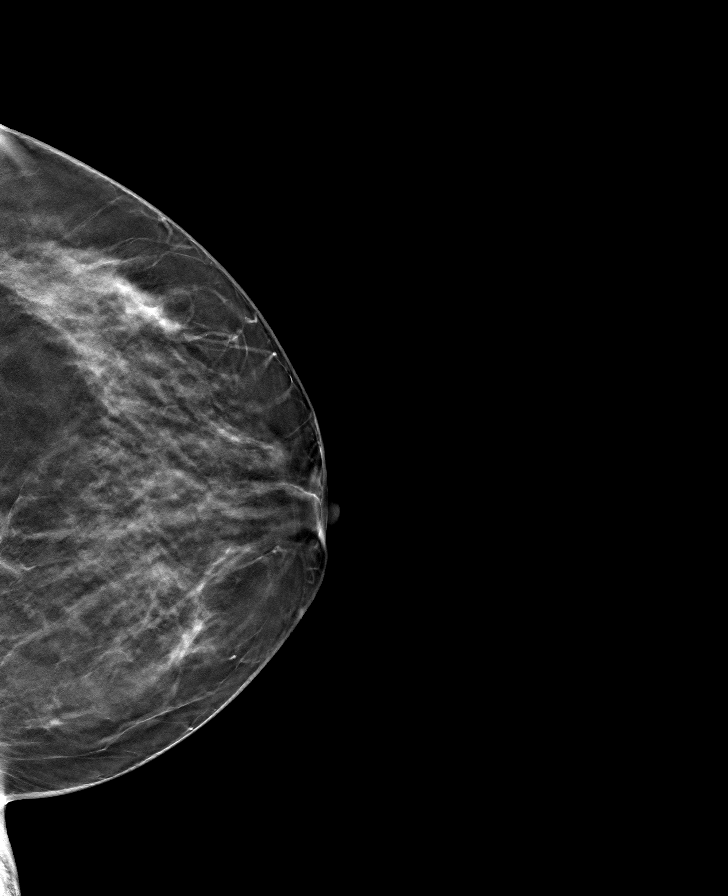

[R MLO tomo · tomo slice 41/81.0]
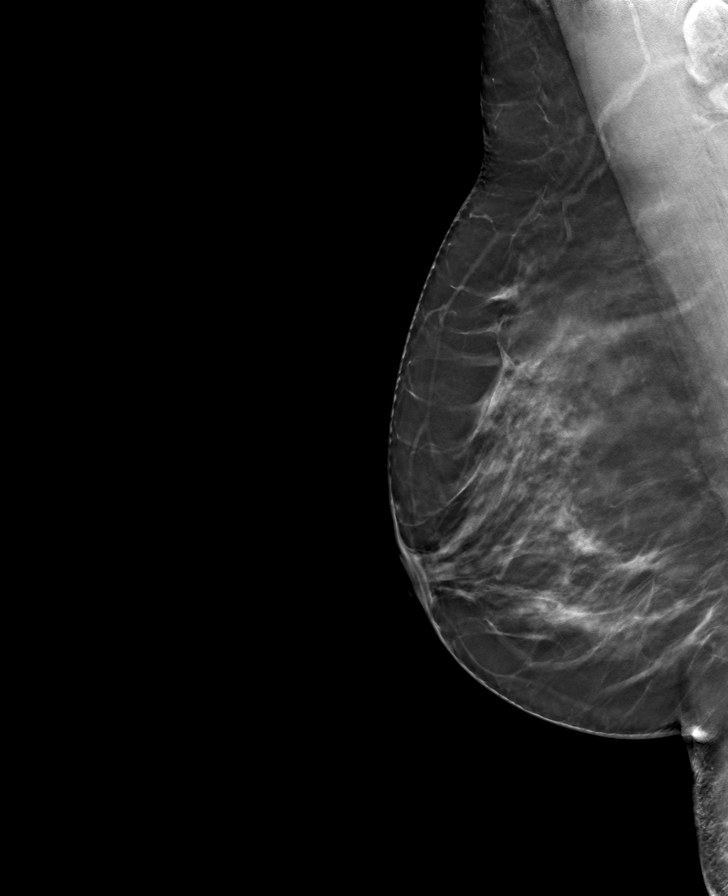

[L MLO tomo · tomo slice 39/76.0]
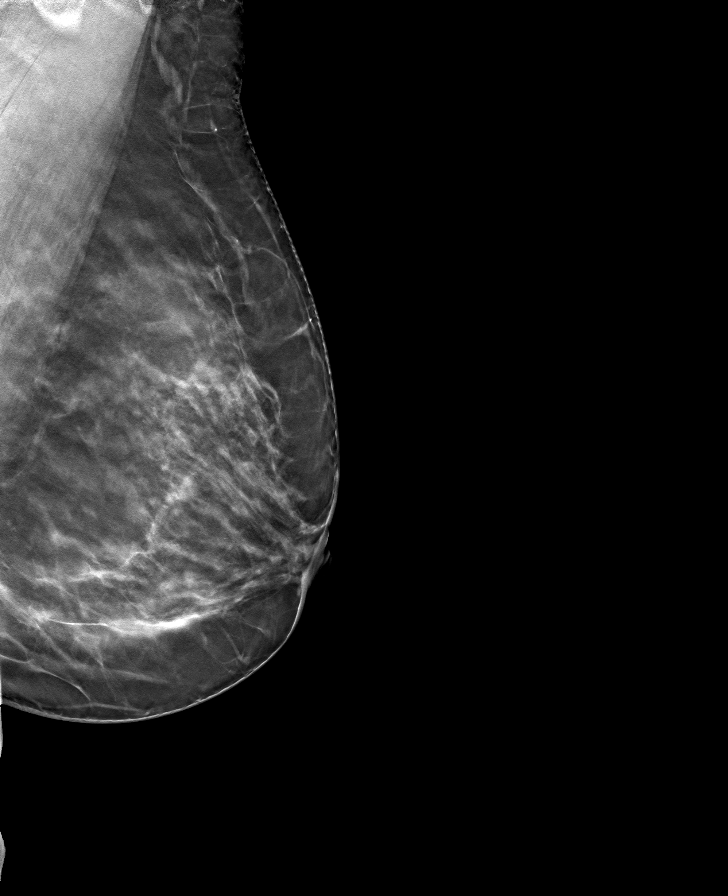

[8 of 24 positions shown; findings below may reference images not displayed]

ACR Breast Density Category c: The breast tissue is heterogeneously
dense, which may obscure small masses.
FINDINGS: No suspicious mammographic findings are identified in either breast.
The parenchymal pattern is stable. Partially visualized left
axillary lymph nodes appear mammographically normal.

Mammographic images were processed with CAD.

Targeted ultrasound is performed, showing normal fibroglandular
tissue without focal or suspicious sonographic abnormality.
Evaluation of the upper outer quadrants of the bilateral breasts was
performed. Evaluation of the axilla demonstrates morphologically
normal lymph nodes.
IMPRESSION: No suspicious mammographic or sonographic findings corresponding
with the patient's clinical symptoms.

RECOMMENDATION:
1. Clinical follow-up recommended for the symptomatic area of
concern in the bilateral breast. Any further workup should be based
on clinical grounds.
2.  Screening mammogram in one year.(Code:9Q-A-BM2)

I have discussed the findings and recommendations with the patient.
Results were also provided in writing at the conclusion of the
visit. If applicable, a reminder letter will be sent to the patient
regarding the next appointment.

BI-RADS CATEGORY  1: Negative.

## 2021-07-16 ENCOUNTER — Other Ambulatory Visit: Payer: Self-pay | Admitting: Obstetrics and Gynecology

## 2021-07-16 DIAGNOSIS — N644 Mastodynia: Secondary | ICD-10-CM

## 2021-08-06 ENCOUNTER — Ambulatory Visit
Admission: RE | Admit: 2021-08-06 | Discharge: 2021-08-06 | Disposition: A | Discharge status: Home / Self Care | Payer: 59 | Source: Ambulatory Visit | Attending: Obstetrics and Gynecology | Admitting: Obstetrics and Gynecology

## 2021-08-06 ENCOUNTER — Other Ambulatory Visit: Payer: Self-pay | Admitting: Obstetrics and Gynecology

## 2021-08-06 ENCOUNTER — Other Ambulatory Visit: Payer: Self-pay

## 2021-08-06 DIAGNOSIS — R2231 Localized swelling, mass and lump, right upper limb: Secondary | ICD-10-CM

## 2021-08-06 DIAGNOSIS — N644 Mastodynia: Secondary | ICD-10-CM

## 2021-09-27 IMAGING — US US AXILLARY RIGHT
1 series · 5 of 5 positions shown · non-contrast
Comparison: Previous exams.

CLINICAL DATA: 50-year-old female with a pea-sized palpable area of
concern in the upper outer right breast which she has had for the
past 3 years. The patient also reports palpable abnormalities in the
bilateral breasts that come and go as well as pain throughout the
bilateral axilla. This was previously evaluated in 9137 with
negative findings.

EXAM:
DIGITAL DIAGNOSTIC BILATERAL MAMMOGRAM WITH TOMOSYNTHESIS AND CAD;
US AXILLARY RIGHT
TECHNIQUE: Bilateral digital diagnostic mammography and breast tomosynthesis
was performed. The images were evaluated with computer-aided
detection.; Targeted ultrasound examination of the right axilla was
performed.

[Series 1: us axillary right · 0.07mm/px · 5 of 5 slices shown]
[im 1/5]
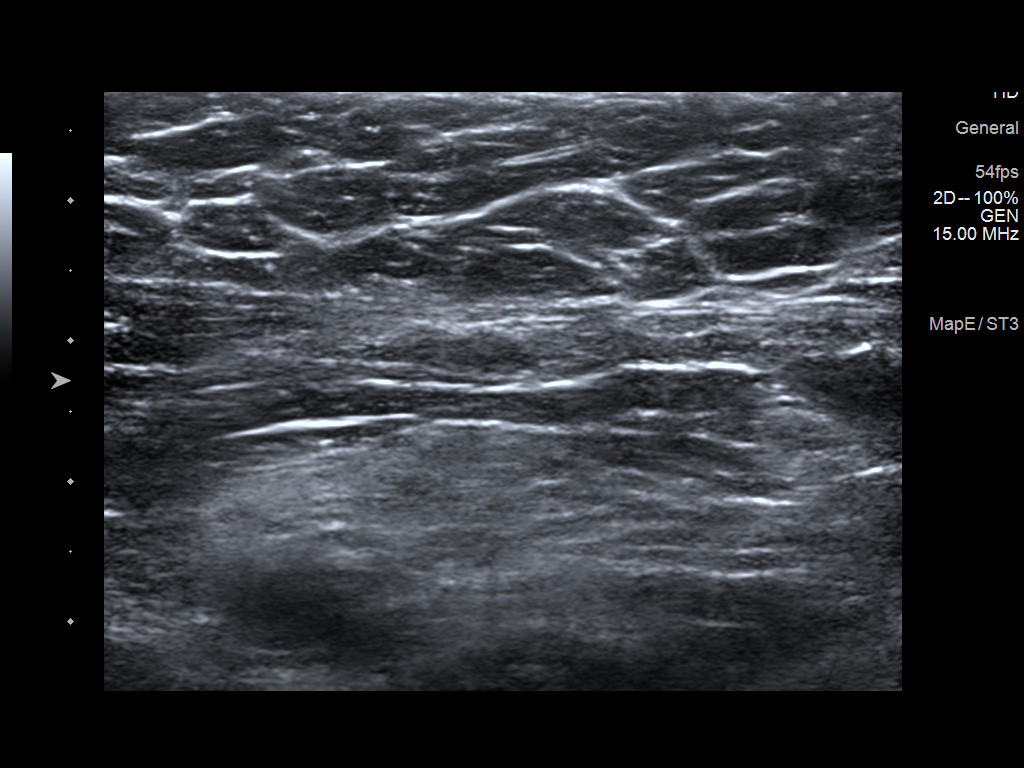
[im 2/5]
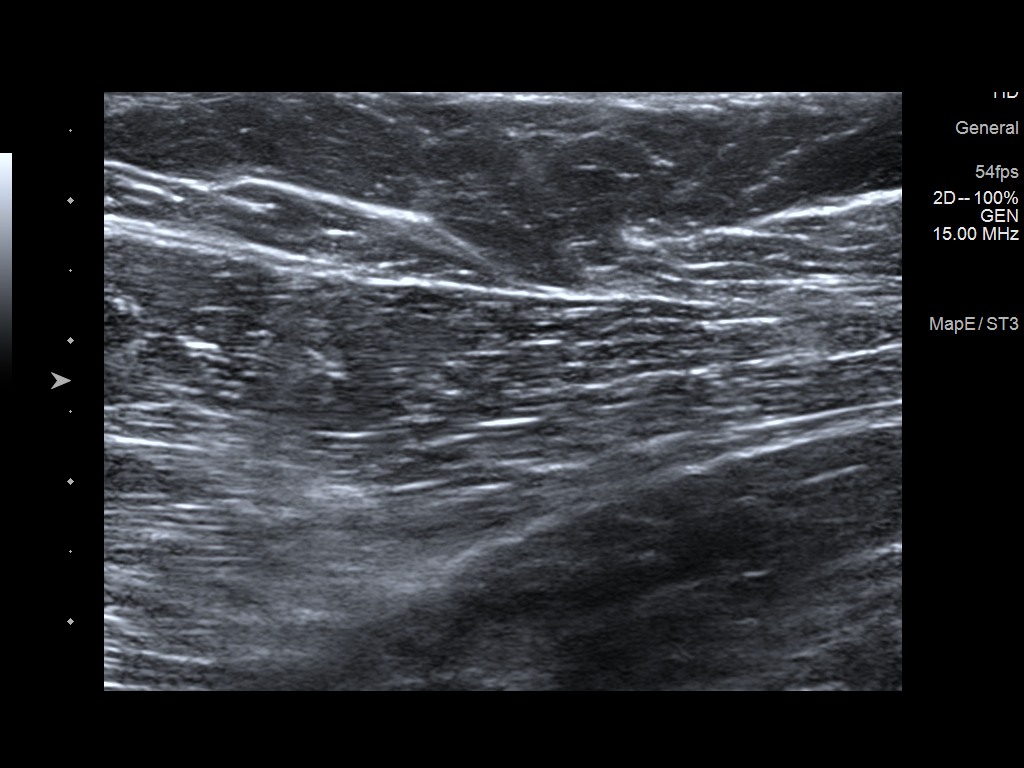
[im 3/5]
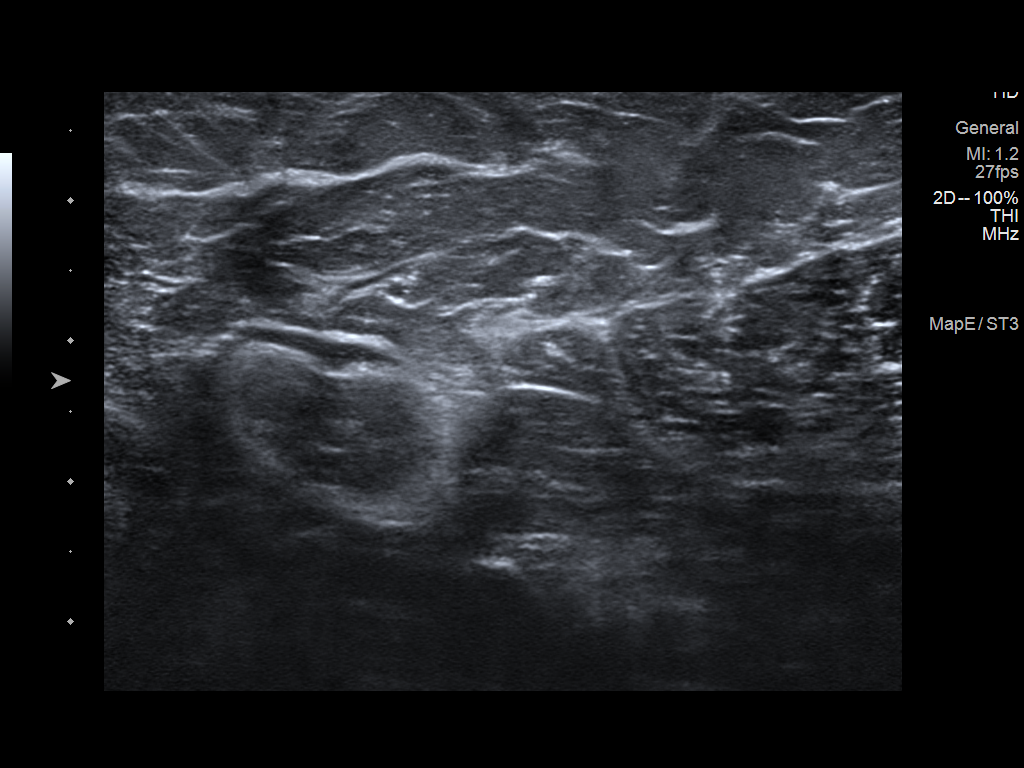
[im 4/5]
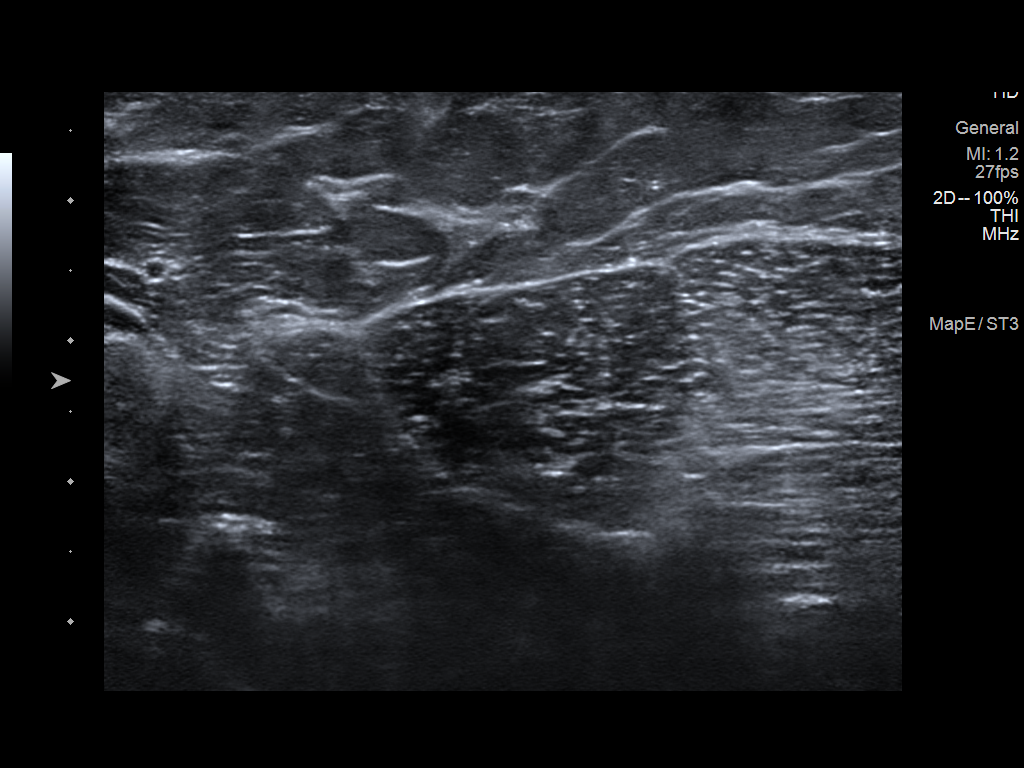
[im 5/5]
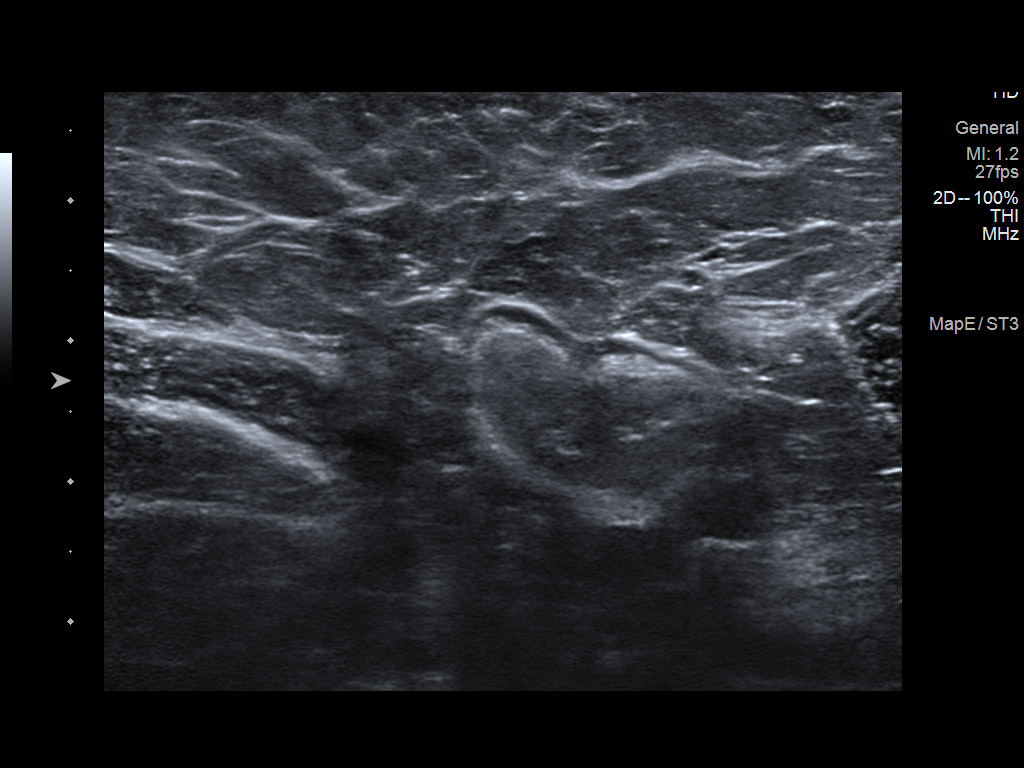

[5 of 5 positions shown; findings below may reference images not displayed]

ACR Breast Density Category c: The breast tissue is heterogeneously
dense, which may obscure small masses.
FINDINGS: No suspicious masses or calcifications are seen in either breast.
Spot-compression MLO tomograms were performed over the palpable area
of concern in the upper-outer right breast no mammographic
abnormality is identified.

Physical examination at site of palpable concern in the far upper
outer right breast/right axilla does not reveal any discrete
palpable masses.

Targeted ultrasound of the right breast was performed. No suspicious
masses or abnormality seen, only normal-appearing fibroglandular
tissue identified.
IMPRESSION: 1. No mammographic or sonographic abnormalities at site of palpable
concern in the right breast/right axilla.

2.  No mammographic evidence of malignancy in either breast.

RECOMMENDATION:
1. Recommend further management of right breast palpable area of
concern be based on clinical assessment.

2.  Screening mammogram in one year.(Code:[id number]-S1Y)

I have discussed the findings and recommendations with the patient.
If applicable, a reminder letter will be sent to the patient
regarding the next appointment.

BI-RADS CATEGORY  1: Negative.

## 2021-12-02 NOTE — Progress Notes (Addendum)
Subjective:    Tammy Ali - 50 y.o. female MRN [id number]  Date of birth: [birth year]  HPI  Tammy Ali is to establish care.    Current issues and/or concerns: Reports constipation sometimes having bowel movement once weekly. Intermittent nausea. Straining and sometimes only rabbit pebbles. Has saw blood in stool and/or with wiping on occasion. Doesn't eat many fruits, vegetables, or drink water. Reports sometimes drinks only 8 ounces daily other times drinking [employer]res[employer]. Thinks headaches related to not drinking enough water. Not ready for colonoscopy or Cologuard as of present.    ROS per HPI    Health Maintenance:  Health Maintenance Due  Topic Date Due   Hepatitis C Screening  Never done   PAP SMEAR-Modifier  Never done     Past Medical History: Patient Active Problem List   Diagnosis Date Noted   Active labor at term 06/13/2016     Social History   reports that she has never smoked. She has never used smokeless tobacco. She reports that she does not drink alcohol and does not use drugs.   Family History  family history is not on file. She was adopted.   Medications: reviewed and updated   Objective:   Physical Exam BP 122/78    Pulse (!) 56    Temp 97.9 F (36.6 C) (Oral)    Resp 16    Ht 5' 4.75" (1.645 m)    Wt 180 lb 6.4 oz (81.8 kg)    SpO2 98%    BMI 30.25 kg/m      Physical Exam HENT:     Head: Normocephalic and atraumatic.  Eyes:     Extraocular Movements: Extraocular movements intact.     Conjunctiva/sclera: Conjunctivae normal.     Pupils: Pupils are equal, round, and reactive to light.  Cardiovascular:     Rate and Rhythm: Regular rhythm. Bradycardia present.     Pulses: Normal pulses.     Heart sounds: Normal heart sounds.  Pulmonary:     Effort: Pulmonary effort is normal.     Breath sounds: Normal breath sounds.  Musculoskeletal:     Cervical back: Normal range of motion and neck supple.  Neurological:     General: No focal  deficit present.     Mental Status: She is alert and oriented to person, place, and time.  Psychiatric:        Mood and Affect: Mood normal.        Behavior: Behavior normal.    Assessment & Plan:  1. Encounter to establish care: - Patient presents today to establish care.  - Return for annual physical examination, labs, and health maintenance. Arrive fasting meaning having no food for at least 8 hours prior to appointment. You may have only water or black coffee. Please take scheduled medications as normal.  2. Constipation, unspecified constipation type: 3. Colon cancer screening declined: - Counseled on the following: Drink plenty of fluid, preferably water, throughout the day Eat foods high in fiber such as fruits, vegetables, and grains Exercise, such as walking, is a good way to keep your bowels regular Drink warm fluids, especially warm prune juice, or decaf coffee Eat a 1/2 cup of real oatmeal (not instant), 1/2 cup applesauce, and 1/2-1 cup warm prune juice every day If needed, you may take Colace (docusate sodium) stool softener or Miralax over-the-counter. - Patient declined colon cancer screening. - Follow-up with primary provider as scheduled.   4. Language barrier: - Patient's daughter, Tammy Ali, serves  as interpreter and part-historian.    Patient was given clear instructions to go to Emergency Department or return to medical center if symptoms don't improve, worsen, or new problems develop.The patient verbalized understanding.  I discussed the assessment and treatment plan with the patient. The patient was provided an opportunity to ask questions and all were answered. The patient agreed with the plan and demonstrated an understanding of the instructions.   The patient was advised to call back or seek an in-person evaluation if the symptoms worsen or if the condition fails to improve as anticipated.    Ricky Stabs, NP 12/11/2021, 9:07 AM [facility]

## 2021-12-11 ENCOUNTER — Other Ambulatory Visit: Payer: Self-pay

## 2021-12-11 ENCOUNTER — Encounter (INDEPENDENT_AMBULATORY_CARE_PROVIDER_SITE_OTHER): Payer: Self-pay

## 2021-12-11 ENCOUNTER — Encounter: Payer: Self-pay | Admitting: Family

## 2021-12-11 ENCOUNTER — Ambulatory Visit: Payer: 59 | Admitting: Family

## 2021-12-11 VITALS — BP 122/78 | HR 56 | Temp 97.9°F | Resp 16 | Ht 64.75 in | Wt 180.4 lb

## 2021-12-11 DIAGNOSIS — Z789 Other specified health status: Secondary | ICD-10-CM | POA: Diagnosis not present

## 2021-12-11 DIAGNOSIS — Z7689 Persons encountering health services in other specified circumstances: Secondary | ICD-10-CM | POA: Diagnosis not present

## 2021-12-11 DIAGNOSIS — Z5320 Procedure and treatment not carried out because of patient's decision for unspecified reasons: Secondary | ICD-10-CM

## 2021-12-11 DIAGNOSIS — K5900 Constipation, unspecified: Secondary | ICD-10-CM | POA: Diagnosis not present

## 2021-12-11 NOTE — Progress Notes (Signed)
Patient is her to establish care with provider.  Patient said that she is unsure if she is a diabetic  Patient c/o having headaches sometimes. Patient would like to check if she has HTN  Patient decline colonoscopy referral

## 2021-12-11 NOTE — Patient Instructions (Signed)
Thank you for choosing [facility] for your medical home!    Tammy Ali was seen by Rema Fendt, NP today.   Tammy Ali's primary care provider is Rema Fendt, NP.   For the best care possible,  you should try to see Ricky Stabs, NP whenever you come to clinic.   We look forward to seeing you again soon!  If you have any questions about your visit today,  please call us at [phone]  Or feel free to reach your provider via MyChart.    Keeping you healthy   Get these tests Blood pressure- Have your blood pressure checked once a year by your healthcare provider.  Normal blood pressure is 120/80. Weight- Have your body mass index (BMI) calculated to screen for obesity.  BMI is a measure of body fat based on height and weight. You can also calculate your own BMI at https://www.west-esparza.com/. Cholesterol- Have your cholesterol checked regularly starting at age 67, sooner may be necessary if you have diabetes, high blood pressure, if a family member developed heart diseases at an early age or if you smoke.  Chlamydia, HIV, and other sexual transmitted disease- Get screened each year until the age of 12 then within three months of each new sexual partner. Diabetes- Have your blood sugar checked regularly if you have high blood pressure, high cholesterol, a family history of diabetes or if you are overweight.   Get these vaccines Flu shot- Every fall. Tetanus shot- Every 10 years. Menactra- Single dose; prevents meningitis.   Take these steps Don't smoke- If you do smoke, ask your healthcare provider about quitting. For tips on how to quit, go to www.smokefree.gov or call 1-800-QUIT-NOW. Be physically active- Exercise 5 days a week for at least 30 minutes.  If you are not already physically active start slow and gradually work up to 30 minutes of moderate physical activity.  Examples of moderate activity include walking briskly, mowing the yard, dancing, swimming  bicycling, etc. Eat a healthy diet- Eat a variety of healthy foods such as fruits, vegetables, low fat milk, low fat cheese, yogurt, lean meats, poultry, fish, beans, tofu, etc.  For more information on healthy eating, go to www.thenutritionsource.org Drink alcohol in moderation- Limit alcohol intake two drinks or less a day.  Never drink and drive. Dentist- Brush and floss teeth twice daily; visit your dentis twice a year. Depression-Your emotional health is as important as your physical health.  If you're feeling down, losing interest in things you normally enjoy please talk with your healthcare provider. Gun Safety- If you keep a gun in your home, keep it unloaded and with the safety lock on.  Bullets should be stored separately. Helmet use- Always wear a helmet when riding a motorcycle, bicycle, rollerblading or skateboarding. Safe sex- If you may be exposed to a sexually transmitted infection, use a condom Seat belts- Seat bels can save your life; always wear one. Smoke/Carbon Monoxide detectors- These detectors need to be installed on the appropriate level of your home.  Replace batteries at least once a year. Skin Cancer- When out in the sun, cover up and use sunscreen SPF 15 or higher. Violence- If anyone is threatening or hurting you, please tell your healthcare provider.

## 2022-01-14 NOTE — Progress Notes (Signed)
Patient ID: Tammy Ali, female    DOB: [birth year]  MRN: [id number]  CC: Annual Physical Exam  Subjective: Tammy Ali is a 51 y.o. female who presents for annual physical exam.   Her concerns today include:  DIZZINESS: Duration: intermittent since 2002  Description of symptoms: room spinning  Duration of episode: minutes Dizziness frequency: 1 to 2 days weekly  Aggravating factors:  feeling tired  Aggravated by exertion, coughing, loud noises: no Recent head injury: no  Nausea: yes Vomiting: yes Tinnitus:  yes, left ear Hearing loss: no Headache: yes taking Tylenol to help Photophobia/phonophobia: yes Unsteady gait: no Postural instability: no Diplopia, dysarthria, dysphagia or weakness: no Diaphoresis: no Dyspnea: no Chest pain: no  Depression screen [facility] 2/9 01/19/2022 12/11/2021 04/17/2016  Decreased Interest 0 0 0  Down, Depressed, Hopeless 0 0 0  PHQ - 2 Score 0 0 0  Altered sleeping - 0 -  Tired, decreased energy - 0 -  Change in appetite - 0 -  Feeling bad or failure about yourself  - 0 -  Trouble concentrating - 1 -  Moving slowly or fidgety/restless - 0 -  Suicidal thoughts - 0 -  PHQ-9 Score - 1 -     Patient Active Problem List   Diagnosis Date Noted   Active labor at term 06/13/2016     No current outpatient medications on file prior to visit.   No current facility-administered medications on file prior to visit.    No Known Allergies  Social History   Socioeconomic History   Marital status: Single    Spouse name: Not on file   Number of children: Not on file   Years of education: Not on file   Highest education level: Not on file  Occupational History   Not on file  Tobacco Use   Smoking status: Never   Smokeless tobacco: Never  Vaping Use   Vaping Use: Never used  Substance and Sexual Activity   Alcohol use: No   Drug use: No   Sexual activity: Yes    Birth control/protection: None  Other Topics Concern   Not on file  Social  History Narrative   Not on file   Social Determinants of Health   Financial Resource Strain: Not on file  Food Insecurity: Not on file  Transportation Needs: Not on file  Physical Activity: Not on file  Stress: Not on file  Social Connections: Not on file  Intimate Partner Violence: Not on file    Family History  Adopted: Yes    Past Surgical History:  Procedure Laterality Date   BRAIN SURGERY     ?crainiotomy ?shunt placement- for headaches    ROS: Review of Systems Negative except as stated above  PHYSICAL EXAM: BP 117/76 (BP Location: Left Arm, Patient Position: Sitting, Cuff Size: Normal)    Pulse (!) 57    Temp 98.3 F (36.8 C)    Resp 18    Ht 5' 4.76" (1.645 m)    Wt 181 lb (82.1 kg)    SpO2 99%    BMI 30.34 kg/m   Physical Exam HENT:     Head: Normocephalic and atraumatic.     Right Ear: Tympanic membrane, ear canal and external ear normal.     Left Ear: Tympanic membrane, ear canal and external ear normal.  Eyes:     Extraocular Movements: Extraocular movements intact.     Conjunctiva/sclera: Conjunctivae normal.     Pupils: Pupils are equal, round,  and reactive to light.  Cardiovascular:     Rate and Rhythm: Bradycardia present.     Pulses: Normal pulses.     Heart sounds: Normal heart sounds.  Pulmonary:     Effort: Pulmonary effort is normal.     Breath sounds: Normal breath sounds.  Abdominal:     General: Bowel sounds are normal.     Palpations: Abdomen is soft.  Genitourinary:    General: Normal vulva.     Vagina: Normal.     Cervix: Normal.     Uterus: Normal.      Adnexa: Right adnexa normal and left adnexa normal.     Comments: Elmon Else, CMA present during exam. Musculoskeletal:        General: Normal range of motion.     Cervical back: Normal range of motion and neck supple.  Skin:    General: Skin is warm and dry.     Capillary Refill: Capillary refill takes less than 2 seconds.  Neurological:     General: No focal deficit  present.     Mental Status: She is alert and oriented to person, place, and time.  Psychiatric:        Mood and Affect: Mood normal.        Behavior: Behavior normal.   ASSESSMENT AND PLAN: 1. Annual physical exam: - Counseled on 150 minutes of exercise per week as tolerated, healthy eating (including decreased daily intake of saturated fats, cholesterol, added sugars, sodium), STI prevention, and routine healthcare maintenance.  2. Screening for metabolic disorder: - YJE56+DJSH to check kidney function, liver function, and electrolyte balance.  - CMP14+EGFR  3. Screening for deficiency anemia: - CBC to screen for anemia. - CBC  4. Diabetes mellitus screening: - Hemoglobin A1c to screen for pre-diabetes/diabetes. - Hemoglobin A1c  5. Screening cholesterol level: - Lipid panel to screen for high cholesterol.  - Lipid panel  6. Thyroid disorder screen: - TSH to check thyroid function.  - TSH  7. Need for hepatitis C screening test: - Hepatitis C antibody to screen for hepatitis C.  - Hepatitis C Antibody  8. Pap smear for cervical cancer screening: - Cytology - PAP for cervical cancer screening.  - Cytology - PAP([place])  9. Routine screening for STI (sexually transmitted infection): - Cervicovaginal self-swab to screen for chlamydia, gonorrhea, trichomonas, bacterial vaginitis, and candida vaginitis. - Cervicovaginal ancillary only  10. Vertigo: 11. Nonintractable headache, unspecified chronicity pattern, unspecified headache type: - Meclizine as prescribed.  - Referral to Neurology for further evaluation and management.  - meclizine (ANTIVERT) 12.5 MG tablet; Take 1 tablet (12.5 mg total) by mouth 3 (three) times daily as needed for dizziness.  Dispense: 30 tablet; Refill: 1 - Ambulatory referral to Neurology  12. Language barrier: - [place] interpreter, Letta Pate, participated during today's appointment.    Patient was given the opportunity to ask questions.   Patient verbalized understanding of the plan and was able to repeat key elements of the plan. Patient was given clear instructions to go to Emergency Department or return to medical center if symptoms don't improve, worsen, or new problems develop.The patient verbalized understanding.   Orders Placed This Encounter  Procedures   Hepatitis C Antibody   CBC   Lipid panel   TSH   CMP14+EGFR   Hemoglobin A1c   Ambulatory referral to Neurology     Requested Prescriptions   Signed Prescriptions Disp Refills   meclizine (ANTIVERT) 12.5 MG tablet 30 tablet 1  Sig: Take 1 tablet (12.5 mg total) by mouth 3 (three) times daily as needed for dizziness.    Return in about 1 year (around 01/19/2023) for Physical per patient preference.  Camillia Herter, NP

## 2022-01-19 ENCOUNTER — Other Ambulatory Visit: Payer: Self-pay

## 2022-01-19 ENCOUNTER — Other Ambulatory Visit (HOSPITAL_COMMUNITY)
Admission: RE | Admit: 2022-01-19 | Discharge: 2022-01-19 | Disposition: A | Discharge status: Home / Self Care | Payer: PRIVATE HEALTH INSURANCE | Source: Ambulatory Visit | Attending: Family | Admitting: Family

## 2022-01-19 ENCOUNTER — Encounter: Payer: Self-pay | Admitting: Family

## 2022-01-19 ENCOUNTER — Encounter (INDEPENDENT_AMBULATORY_CARE_PROVIDER_SITE_OTHER): Payer: Self-pay

## 2022-01-19 ENCOUNTER — Ambulatory Visit (INDEPENDENT_AMBULATORY_CARE_PROVIDER_SITE_OTHER): Payer: 59 | Admitting: Family

## 2022-01-19 VITALS — BP 117/76 | HR 57 | Temp 98.3°F | Resp 18 | Ht 64.76 in | Wt 181.0 lb

## 2022-01-19 DIAGNOSIS — Z1159 Encounter for screening for other viral diseases: Secondary | ICD-10-CM

## 2022-01-19 DIAGNOSIS — Z13220 Encounter for screening for lipoid disorders: Secondary | ICD-10-CM

## 2022-01-19 DIAGNOSIS — Z113 Encounter for screening for infections with a predominantly sexual mode of transmission: Secondary | ICD-10-CM

## 2022-01-19 DIAGNOSIS — Z603 Acculturation difficulty: Secondary | ICD-10-CM

## 2022-01-19 DIAGNOSIS — Z0000 Encounter for general adult medical examination without abnormal findings: Secondary | ICD-10-CM

## 2022-01-19 DIAGNOSIS — Z131 Encounter for screening for diabetes mellitus: Secondary | ICD-10-CM

## 2022-01-19 DIAGNOSIS — Z124 Encounter for screening for malignant neoplasm of cervix: Secondary | ICD-10-CM | POA: Diagnosis not present

## 2022-01-19 DIAGNOSIS — Z13228 Encounter for screening for other metabolic disorders: Secondary | ICD-10-CM

## 2022-01-19 DIAGNOSIS — Z0001 Encounter for general adult medical examination with abnormal findings: Secondary | ICD-10-CM

## 2022-01-19 DIAGNOSIS — R519 Headache, unspecified: Secondary | ICD-10-CM

## 2022-01-19 DIAGNOSIS — Z1329 Encounter for screening for other suspected endocrine disorder: Secondary | ICD-10-CM

## 2022-01-19 DIAGNOSIS — Z130 Encounter for screening for diseases of the blood and blood-forming organs and certain disorders involving the immune mechanism: Secondary | ICD-10-CM

## 2022-01-19 DIAGNOSIS — Z789 Other specified health status: Secondary | ICD-10-CM

## 2022-01-19 DIAGNOSIS — R42 Dizziness and giddiness: Secondary | ICD-10-CM

## 2022-01-19 MED ORDER — MECLIZINE HCL 12.5 MG PO TABS: 12.5000 mg | ORAL_TABLET | Freq: Three times a day (TID) | ORAL | 1 refills | Status: DC | PRN

## 2022-01-19 NOTE — Progress Notes (Signed)
Pt presents for annual physical exam w/pap, pt states for about 2 months now been experiencing dizziness when stand symptoms include nausea, headache and some vomiting

## 2022-01-19 NOTE — Patient Instructions (Signed)

## 2022-01-20 ENCOUNTER — Other Ambulatory Visit: Payer: Self-pay | Admitting: Family

## 2022-01-20 ENCOUNTER — Encounter: Payer: Self-pay | Admitting: Neurology

## 2022-01-20 DIAGNOSIS — E119 Type 2 diabetes mellitus without complications: Secondary | ICD-10-CM | POA: Insufficient documentation

## 2022-01-20 DIAGNOSIS — Z130 Encounter for screening for diseases of the blood and blood-forming organs and certain disorders involving the immune mechanism: Secondary | ICD-10-CM

## 2022-01-20 LAB — CERVICOVAGINAL ANCILLARY ONLY
Bacterial Vaginitis (gardnerella): NEGATIVE
Candida Glabrata: NEGATIVE
Candida Vaginitis: NEGATIVE
Chlamydia: NEGATIVE
Comment: NEGATIVE
Comment: NEGATIVE
Comment: NEGATIVE
Comment: NEGATIVE
Comment: NEGATIVE
Comment: NORMAL
Neisseria Gonorrhea: NEGATIVE
Trichomonas: NEGATIVE

## 2022-01-20 LAB — CMP14+EGFR
ALT: 14 IU/L (ref 0–32)
AST: 19 IU/L (ref 0–40)
Albumin/Globulin Ratio: 1.5 (ref 1.2–2.2)
Albumin: 4 g/dL (ref 3.8–4.8)
Alkaline Phosphatase: 109 IU/L (ref 44–121)
BUN/Creatinine Ratio: 18 (ref 9–23)
BUN: 14 mg/dL (ref 6–24)
Bilirubin Total: 0.2 mg/dL (ref 0.0–1.2)
CO2: 22 mmol/L (ref 20–29)
Calcium: 8.3 mg/dL — ABNORMAL LOW (ref 8.7–10.2)
Chloride: 106 mmol/L (ref 96–106)
Creatinine, Ser: 0.79 mg/dL (ref 0.57–1.00)
Globulin, Total: 2.7 g/dL (ref 1.5–4.5)
Glucose: 106 mg/dL — ABNORMAL HIGH (ref 70–99)
Potassium: 4.7 mmol/L (ref 3.5–5.2)
Sodium: 142 mmol/L (ref 134–144)
Total Protein: 6.7 g/dL (ref 6.0–8.5)
eGFR: 91 mL/min/{1.73_m2} (ref >59)

## 2022-01-20 LAB — LIPID PANEL
Chol/HDL Ratio: 4.2 ratio (ref 0.0–4.4)
Cholesterol, Total: 191 mg/dL (ref 100–199)
HDL: 46 mg/dL (ref >39)
LDL Chol Calc (NIH): 126 mg/dL — ABNORMAL HIGH (ref 0–99)
Triglycerides: 103 mg/dL (ref 0–149)
VLDL Cholesterol Cal: 19 mg/dL (ref 5–40)

## 2022-01-20 LAB — CBC
Hematocrit: 38 % (ref 34.0–46.6)
Hemoglobin: 10.2 g/dL — ABNORMAL LOW (ref 11.1–15.9)
MCH: 16.1 pg — ABNORMAL LOW (ref 26.6–33.0)
MCHC: 26.8 g/dL — ABNORMAL LOW (ref 31.5–35.7)
MCV: 60 fL — ABNORMAL LOW (ref 79–97)
Platelets: 266 10*3/uL (ref 150–450)
RBC: 6.34 x10E6/uL — ABNORMAL HIGH (ref 3.77–5.28)
RDW: 20 % — ABNORMAL HIGH (ref 11.7–15.4)
WBC: 6.3 10*3/uL (ref 3.4–10.8)

## 2022-01-20 LAB — TSH: TSH: 2.73 u[IU]/mL (ref 0.450–4.500)

## 2022-01-20 LAB — HEPATITIS C ANTIBODY: Hep C Virus Ab: <0.1 s/co ratio (ref 0.0–0.9)

## 2022-01-20 LAB — HEMOGLOBIN A1C
Est. average glucose Bld gHb Est-mCnc: 140 mg/dL
Hgb A1c MFr Bld: 6.5 % — ABNORMAL HIGH (ref 4.8–5.6)

## 2022-01-20 MED ORDER — METFORMIN HCL 500 MG PO TABS: 500.0000 mg | ORAL_TABLET | Freq: Two times a day (BID) | ORAL | 0 refills | Status: DC

## 2022-01-20 NOTE — Progress Notes (Signed)
Please call patient with update.  ° °Gonorrhea, Chlamydia, Trichomonas, Bacterial Vaginitis, and Candida Vaginitis (sometimes called a yeast infection) negative.

## 2022-01-20 NOTE — Progress Notes (Signed)
Please call patient with update.   The following abnormalities are noted:    - Adding iron panel to further screen for anemia.  - Calcium lower than normal. Increase calcium-rich foods such as green leafy vegetables, milk, yogurt, and cheese. Recheck calcium in 4 to 6 weeks.  - LDL cholesterol (sometimes called bad cholesterol) higher than expected. High cholesterol may increase risk of heart attack and/or stroke. Consider eating more fruits, vegetables, and lean baked meats such as chicken or fish. Moderate intensity exercise at least 150 minutes as tolerated per week may help as well. No medication needed at the moment. Recheck routinely.  - Hemoglobin A1c is consistent with diabetes. Practice healthy eating habits of fresh fruit and vegetables, lean baked meats such as chicken, fish, and [place]; limit breads, rice, pastas, and desserts; practice regular aerobic exercise (at least 150 minutes a week as tolerated). Begin Metformin as prescribed for diabetes. Follow-up with primary provider in 4 weeks or sooner if needed. Schedule appointment while patient on phone.  All other values are normal, stable or within acceptable limits.  Medication changes / Follow up labs / Other changes or recommendations:   - Begin Metformin for diabetes.  - Recheck calcium lab in 4 to 6 weeks. - Recheck cholesterol lab routinely.   The following is for provider reference only: The 10-year ASCVD risk score (Arnett DK, et al., 2019) is: 1.2%   Values used to calculate the score:     Age: 51 years     Sex: Female     Is Non-Hispanic African American: No     Diabetic: No     Tobacco smoker: No     Systolic Blood Pressure: 117 mmHg     Is BP treated: No     HDL Cholesterol: 46 mg/dL     Total Cholesterol: 191 mg/dL   Rema Fendt, NP 08/19/7590 7:42 AM

## 2022-01-21 LAB — CYTOLOGY - PAP
Comment: NEGATIVE
Diagnosis: NEGATIVE
High risk HPV: NEGATIVE

## 2022-01-21 NOTE — Progress Notes (Signed)
Please call patient with update.   The following abnormalities are noted:  - None.  - PAP without lesion or malignancy.   - HPV negative.  All other values are normal, stable or within acceptable limits.  Medication changes / Follow up labs / Other changes or recommendations:   - Repeat PAP in 3 years or sooner if needed.   Camillia Herter, NP 01/21/2022 4:34 PM

## 2022-03-01 NOTE — Progress Notes (Signed)
Erroneous encounter

## 2022-03-05 ENCOUNTER — Encounter: Payer: PRIVATE HEALTH INSURANCE | Admitting: Family

## 2022-03-05 DIAGNOSIS — E119 Type 2 diabetes mellitus without complications: Secondary | ICD-10-CM

## 2022-04-02 NOTE — Progress Notes (Signed)
Erroneous encounter

## 2022-04-03 ENCOUNTER — Encounter: Payer: Self-pay | Admitting: Family

## 2022-04-08 ENCOUNTER — Encounter: Payer: PRIVATE HEALTH INSURANCE | Admitting: Family

## 2022-04-08 DIAGNOSIS — Z13228 Encounter for screening for other metabolic disorders: Secondary | ICD-10-CM

## 2022-04-08 DIAGNOSIS — Z789 Other specified health status: Secondary | ICD-10-CM

## 2022-04-08 DIAGNOSIS — E119 Type 2 diabetes mellitus without complications: Secondary | ICD-10-CM

## 2022-04-09 NOTE — Progress Notes (Signed)
? ?NEUROLOGY CONSULTATION NOTE ? ?See Surette ?MRN: [id number] ?DOB: 1971-10-24 ? ?Referring provider: Ricky Stabs, NP ?Primary care provider: Ricky Stabs, NP ? ?Reason for consult:  dizziness, headache ? ?Assessment/Plan:  ? ?Left sided occipital neuralgia ?Left sided cervicalgia ? ?Start gabapentin titrating to 200mg  twice daily.  We can increase dose if needed. ?Refer to physical therapy for neck pain and occipital neuralgia ?Follow up 4 to 5 months. ? ? ? ?Subjective:  ?Tammy Ali is a 51 year old female who presents for dizziness and headache.  History supplemented by referring provider's note.  Interpreter present. ? ?She has had a headache for the past 10 years.  She reports shooting pain in the left upper cervical paraspinal region that shoots up to top of head.  It is aggravated when palpating that left upper cervical region or with neck movement.  No radiating pain down the arm.  CT head on 09/25/2014 to evaluated this pain showed abnormal appearance of left side of sella and cavernous sinus.  However, follow up MRI of pituitary with and without contrast on 10/24/2014 was normal, indicating that the CT finding was related to partial volume artifact.   ? ?She had "brain surgery" 20 years ago.  Does not remember why.   ? ?Current medications:  acetaminophen ? ?  ? ? ?PAST MEDICAL HISTORY: ?Past Medical History:  ?Diagnosis Date  ? Anemia   ? GDM (gestational diabetes mellitus)   ? Hepatitis B carrier (HCC)   ? ? ?PAST SURGICAL HISTORY: ?Past Surgical History:  ?Procedure Laterality Date  ? BRAIN SURGERY    ? ?crainiotomy ?shunt placement- for headaches  ? ? ?MEDICATIONS: ?Current Outpatient Medications on File Prior to Visit  ?Medication Sig Dispense Refill  ? meclizine (ANTIVERT) 12.5 MG tablet Take 1 tablet (12.5 mg total) by mouth 3 (three) times daily as needed for dizziness. 30 tablet 1  ? metFORMIN (GLUCOPHAGE) 500 MG tablet Take 1 tablet (500 mg total) by mouth 2 (two) times daily with a meal. 60 tablet  0  ? ?No current facility-administered medications on file prior to visit.  ? ? ?ALLERGIES: ?No Known Allergies ? ?FAMILY HISTORY: ?Family History  ?Adopted: Yes  ? ? ?Objective:  ?Blood pressure 127/80, pulse 61, height 5\' 3"  (1.6 m), weight 181 lb (82.1 kg), SpO2 96 %, unknown if currently breastfeeding. ?General: No acute distress.  Patient appears well-groomed.   ?Head:  Normocephalic/atraumatic ?Eyes:  fundi examined but not visualized ?Neck: supple, left sided suboccipital and upper cervical paraspinal tenderness, full range of motion ?Heart: regular rate and rhythm ?Neurological Exam: ?Mental status: alert and oriented to person, place, and time, recent and remote memory intact, fund of knowledge intact, attention and concentration intact, speech fluent and not dysarthric, language intact. ?Cranial nerves: ?CN I: not tested ?CN II: pupils equal, round and reactive to light, visual fields intact ?CN III, IV, VI:  full range of motion, no nystagmus, no ptosis ?CN V: facial sensation intact. ?CN VII: upper and lower face symmetric ?CN VIII: hearing intact ?CN IX, X: gag intact, uvula midline ?CN XI: sternocleidomastoid and trapezius muscles intact ?CN XII: tongue midline ?Bulk & Tone: normal, no fasciculations. ?Motor:  muscle strength 5/5 throughout ?Sensation:  Temperature and vibratory sensation intact. ?Deep Tendon Reflexes:  2+ throughout,  toes downgoing.   ?Finger to nose testing:  Without dysmetria.   ?Gait:  Normal station and stride.  Romberg negative. ? ? ? ?Thank you for allowing me to take part in the care of  this patient. ? ?Shon Millet, DO ? ?CC: Ricky Stabs, NP ? ? ? ? ?

## 2022-04-13 ENCOUNTER — Encounter: Payer: Self-pay | Admitting: Neurology

## 2022-04-13 ENCOUNTER — Ambulatory Visit: Payer: 59 | Admitting: Neurology

## 2022-04-13 VITALS — BP 127/80 | HR 61 | Ht 63.0 in | Wt 181.0 lb

## 2022-04-13 DIAGNOSIS — M542 Cervicalgia: Secondary | ICD-10-CM | POA: Diagnosis not present

## 2022-04-13 DIAGNOSIS — M5481 Occipital neuralgia: Secondary | ICD-10-CM

## 2022-04-13 MED ORDER — GABAPENTIN 100 MG PO CAPS: ORAL_CAPSULE | ORAL | 0 refills | Status: DC

## 2022-04-13 NOTE — Patient Instructions (Signed)
Start gabapentin.  Take 1 pill at bedtime for a week, then 1 pill twice daily for a week, then 2 pills twice daily. ?Refer to physical therapy for neck pain and left sided occipital neuralgia ?Follow up 4 to 5 months. ?

## 2022-04-14 ENCOUNTER — Encounter: Payer: Self-pay | Admitting: Physician Assistant

## 2022-04-14 ENCOUNTER — Ambulatory Visit (INDEPENDENT_AMBULATORY_CARE_PROVIDER_SITE_OTHER): Payer: 59 | Admitting: Physician Assistant

## 2022-04-14 VITALS — BP 126/81 | HR 62 | Temp 98.2°F | Resp 18 | Ht 63.0 in | Wt 179.0 lb

## 2022-04-14 DIAGNOSIS — Z130 Encounter for screening for diseases of the blood and blood-forming organs and certain disorders involving the immune mechanism: Secondary | ICD-10-CM

## 2022-04-14 DIAGNOSIS — E119 Type 2 diabetes mellitus without complications: Secondary | ICD-10-CM

## 2022-04-14 DIAGNOSIS — R519 Headache, unspecified: Secondary | ICD-10-CM | POA: Diagnosis not present

## 2022-04-14 LAB — POCT GLYCOSYLATED HEMOGLOBIN (HGB A1C): Hemoglobin A1C: 6.6 % — AB (ref 4.0–5.6)

## 2022-04-14 MED ORDER — METFORMIN HCL 500 MG PO TABS: 500.0000 mg | ORAL_TABLET | Freq: Two times a day (BID) | ORAL | 2 refills | Status: DC

## 2022-04-14 NOTE — Progress Notes (Signed)
? ?Established Patient Office Visit ? ?Subjective   ?Patient ID: Tammy Ali, female    DOB: [birth year]  Age: 51 y.o. MRN: [id number] ? ?Chief Complaint  ?Patient presents with  ? Follow-up  ?  DM  ? ? ?States that she has been checking her BG levels at home and has been having 120-130 before eating. ? ?States that she is unable to read and is not sure what pill she is taking, but does state that she is taking a small tablet daily. ? ?States that she was seen by neurology yesterday.  Note from that visit ? ?Reason for consult:  dizziness, headache ?  ?Assessment/Plan: ?  ?1. Left sided occipital neuralgia ?2. Left sided cervicalgia ?  ?1. Start gabapentin titrating to 200mg  twice daily.  We can increase dose if needed. ?2. Refer to physical therapy for neck pain and occipital neuralgia ?3. Follow up 4 to 5 months. ?  ?Due to language barrier, an interpreter was present during the history-taking and subsequent discussion (and for part of the physical exam) with this patient. ? ? ? ?Past Medical History:  ?Diagnosis Date  ? Anemia   ? GDM (gestational diabetes mellitus)   ? Hepatitis B carrier ([place])   ? ?Social History  ? ?Socioeconomic History  ? Marital status: Legally Separated  ?  Spouse name: Not on file  ? Number of children: Not on file  ? Years of education: Not on file  ? Highest education level: Not on file  ?Occupational History  ? Not on file  ?Tobacco Use  ? Smoking status: Never  ? Smokeless tobacco: Never  ?Vaping Use  ? Vaping Use: Never used  ?Substance and Sexual Activity  ? Alcohol use: No  ? Drug use: No  ? Sexual activity: Yes  ?  Birth control/protection: None  ?Other Topics Concern  ? Not on file  ?Social History Narrative  ? ** Merged History Encounter **  ?    ? ?Social Determinants of Health  ? ?Financial Resource Strain: Not on file  ?Food Insecurity: Not on file  ?Transportation Needs: Not on file  ?Physical Activity: Not on file  ?Stress: Not on file  ?Social Connections: Not on file   ?Intimate Partner Violence: Not on file  ? ?Family History  ?Adopted: Yes  ? ?No Known Allergies ?  ? ?Review of Systems  ?Constitutional: Negative.   ?HENT: Negative.    ?Eyes: Negative.   ?Respiratory:  Negative for shortness of breath.   ?Cardiovascular:  Negative for chest pain.  ?Gastrointestinal: Negative.   ?Genitourinary: Negative.   ?Musculoskeletal: Negative.   ?Skin: Negative.   ?Neurological:  Positive for headaches.  ?Endo/Heme/Allergies: Negative.   ?Psychiatric/Behavioral: Negative.    ? ?  ?Objective:  ?  ? ?BP 126/81 (BP Location: Left Arm, Patient Position: Sitting, Cuff Size: Normal)   Pulse 62   Temp 98.2 ?F (36.8 ?C) (Oral)   Resp 18   Ht 5\' 3"  (1.6 m)   Wt 179 lb (81.2 kg)   LMP 03/23/2022   SpO2 98%   Breastfeeding No   BMI 31.71 kg/m?  ? ? ?Physical Exam ?Vitals and nursing note reviewed.  ?Constitutional:   ?   Appearance: Normal appearance.  ?HENT:  ?   Head: Normocephalic and atraumatic.  ?   Right Ear: External ear normal.  ?   Left Ear: External ear normal.  ?   Nose: Nose normal.  ?   Mouth/Throat:  ?   Mouth: Mucous  membranes are moist.  ?   Pharynx: Oropharynx is clear.  ?Eyes:  ?   Extraocular Movements: Extraocular movements intact.  ?   Conjunctiva/sclera: Conjunctivae normal.  ?   Pupils: Pupils are equal, round, and reactive to light.  ?Cardiovascular:  ?   Rate and Rhythm: Normal rate and regular rhythm.  ?   Pulses: Normal pulses.  ?Pulmonary:  ?   Effort: Pulmonary effort is normal.  ?   Breath sounds: Normal breath sounds.  ?Musculoskeletal:     ?   General: Normal range of motion.  ?   Cervical back: Normal range of motion and neck supple.  ?Skin: ?   General: Skin is warm and dry.  ?Neurological:  ?   General: No focal deficit present.  ?   Mental Status: She is alert and oriented to person, place, and time.  ?Psychiatric:     ?   Mood and Affect: Mood normal.     ?   Behavior: Behavior normal.     ?   Thought Content: Thought content normal.     ?   Judgment:  Judgment normal.  ? ? ? ?  ?Assessment & Plan:  ? ?Problem List Items Addressed This Visit   ? ?  ? Endocrine  ? Diabetes mellitus, type 2 ([place]) - Primary  ? Relevant Medications  ? metFORMIN (GLUCOPHAGE) 500 MG tablet  ? Other Relevant Orders  ? Basic metabolic panel  ? Microalbumin / creatinine urine ratio  ? HgB A1c  ? ?Other Visit Diagnoses   ? ? Screening for deficiency anemia      ? Relevant Orders  ? Iron, TIBC and Ferritin Panel  ? CBC with Differential/Platelet  ? Nonintractable headache, unspecified chronicity pattern, unspecified headache type      ? ?  ? ?1. Type 2 diabetes mellitus without complication, without long-term current use of insulin ([place]) ?A1C 6.6.  patient was given written and verbal instructions on how to properly use her medications.  Patient encouraged to have someone at home help her get her medications set up properly.  Patient encouraged to continue checking blood glucose on a daily basis, keep a written log and have available for all office visits. ?- Basic metabolic panel ?- Microalbumin / creatinine urine ratio ?- metFORMIN (GLUCOPHAGE) 500 MG tablet; Take 1 tablet (500 mg total) by mouth 2 (two) times daily with a meal.  Dispense: 60 tablet; Refill: 2 ?- HgB A1c ? ?2. Screening for deficiency anemia ? ?- Iron, TIBC and Ferritin Panel ?- CBC with Differential/Platelet ? ?3. Nonintractable headache, unspecified chronicity pattern, unspecified headache type ?Patient encouraged to continue follow-up with neurology ? ? ?I have reviewed the patient's medical history (PMH, PSH, Social History, Family History, Medications, and allergies) , and have been updated if relevant. I spent 30 minutes reviewing chart and  face to face time with patient. ? ? ? ?Return in about 3 months (around 07/15/2022) for with Durene Fruits, NP at [place] [facility].  ? ? ?Kahlen Boyde S Mayers, PA-C ? ?

## 2022-04-14 NOTE — Patient Instructions (Addendum)
Metformin -you should take this medication twice a day, once with breakfast and once with dinner. ? ?Gabapentin-this is the new medication from the neurologist.  You will take 1 capsule at bedtime for 1 week, then you will start taking 1 capsule twice a day for 1 week, then you will take 2 capsules twice a day.  You will continue this medication until you follow-up with neurology again ? ?We will call you and your daughter with your lab results from today, make sure that you follow-up with your daughter and the lab results to make sure you understand if you need to take any other medications. ? ?Roney Jaffe, PA-C ?Physician Assistant ?[facility] ?[email or web address] ? ? ? ?

## 2022-04-15 LAB — CBC WITH DIFFERENTIAL/PLATELET
Basophils Absolute: 0 10*3/uL (ref 0.0–0.2)
Basos: 0 %
EOS (ABSOLUTE): 0.1 10*3/uL (ref 0.0–0.4)
Eos: 1 %
Hematocrit: 41.5 % (ref 34.0–46.6)
Hemoglobin: 11.6 g/dL (ref 11.1–15.9)
Immature Grans (Abs): 0 10*3/uL (ref 0.0–0.1)
Immature Granulocytes: 0 %
Lymphocytes Absolute: 1.7 10*3/uL (ref 0.7–3.1)
Lymphs: 33 %
MCH: 16.8 pg — ABNORMAL LOW (ref 26.6–33.0)
MCHC: 28 g/dL — ABNORMAL LOW (ref 31.5–35.7)
MCV: 60 fL — ABNORMAL LOW (ref 79–97)
Monocytes Absolute: 0.3 10*3/uL (ref 0.1–0.9)
Monocytes: 6 %
Neutrophils Absolute: 3.1 10*3/uL (ref 1.4–7.0)
Neutrophils: 60 %
Platelets: 267 10*3/uL (ref 150–450)
RBC: 6.92 x10E6/uL — ABNORMAL HIGH (ref 3.77–5.28)
RDW: 21.6 % — ABNORMAL HIGH (ref 11.7–15.4)
WBC: 5.3 10*3/uL (ref 3.4–10.8)

## 2022-04-15 LAB — IRON,TIBC AND FERRITIN PANEL
Ferritin: 16 ng/mL (ref 15–150)
Iron Saturation: 13 % — ABNORMAL LOW (ref 15–55)
Iron: 43 ug/dL (ref 27–159)
Total Iron Binding Capacity: 336 ug/dL (ref 250–450)
UIBC: 293 ug/dL (ref 131–425)

## 2022-04-15 LAB — MICROALBUMIN / CREATININE URINE RATIO
Creatinine, Urine: 58.3 mg/dL
Microalb/Creat Ratio: <5 mg/g creat (ref 0–29)
Microalbumin, Urine: <3 ug/mL

## 2022-04-15 LAB — BASIC METABOLIC PANEL
BUN/Creatinine Ratio: 18 (ref 9–23)
BUN: 13 mg/dL (ref 6–24)
CO2: 20 mmol/L (ref 20–29)
Calcium: 8.9 mg/dL (ref 8.7–10.2)
Chloride: 105 mmol/L (ref 96–106)
Creatinine, Ser: 0.72 mg/dL (ref 0.57–1.00)
Glucose: 105 mg/dL — ABNORMAL HIGH (ref 70–99)
Potassium: 4.7 mmol/L (ref 3.5–5.2)
Sodium: 141 mmol/L (ref 134–144)
eGFR: 101 mL/min/{1.73_m2} (ref >59)

## 2022-04-16 ENCOUNTER — Telehealth: Payer: Self-pay | Admitting: *Deleted

## 2022-04-16 NOTE — Telephone Encounter (Signed)
-----   Message from Roney Jaffe, PA-C sent at 04/15/2022  1:15 PM EDT ----- ?Please call patient and let her know that her kidney function is within normal limits, she does not show signs of anemia.  Her microalbumin urine test is still pending. ?

## 2022-04-16 NOTE — Telephone Encounter (Signed)
MA UTR patient x2. LVM to call the office for results. [phone]. ?

## 2022-05-11 NOTE — Therapy (Signed)
OUTPATIENT PHYSICAL THERAPY CERVICAL EVALUATION   Patient Name: Tammy Ali MRN: [id number] DOB:January 22, 1971, 51 y.o., female Today's Date: 05/12/2022   PT End of Session - 05/12/22 0908     Visit Number 1    Number of Visits 13    Date for PT Re-Evaluation 07/03/22    Authorization Type [employer]    Progress Note Due on Visit 10    PT Start Time 0805    PT Stop Time 0850    PT Time Calculation (min) 45 min    Activity Tolerance Patient tolerated treatment well    Behavior During Therapy WFL for tasks assessed/performed             Past Medical History:  Diagnosis Date   Anemia    GDM (gestational diabetes mellitus)    Hepatitis B carrier (HCC)    Past Surgical History:  Procedure Laterality Date   BRAIN SURGERY     ?crainiotomy ?shunt placement- for headaches   Patient Active Problem List   Diagnosis Date Noted   Diabetes mellitus, type 2 (HCC) 01/20/2022   Active labor at term 06/13/2016    PCP: Rema Fendt, NP  REFERRING PROVIDER: Drema Dallas, DO  REFERRING DIAG: Cervicalgia  THERAPY DIAG:  Cervicalgia  Abnormal posture  Cramp and spasm  Rationale for Evaluation and Treatment Rehabilitation  ONSET DATE: 10+ years  SUBJECTIVE:                                                                                                                                                                                                         SUBJECTIVE STATEMENT: Pt reports no MOI, pain has been on/off for 10 years. When hurting unable to touch neck. Frequency varies from frequency 3 days to 2 weeks. Since starting the medication 2 weeks ago, pt has had only 1 episode of pain last Thursday at a lower level. Exs generally at gym 2-3 days a week    PERTINENT HISTORY:  DM  PAIN:  Are you having pain? Yes: NPRS scale: 0-10/10 Pain location: Pain of the L upper shoulder and neck and ram's horn HA.  Pain description: Throbbing,  Aggravating factors:  Coughing, sneezing, Turning head to the L  Relieving factors: Pt is taking medication which is helpful, but makes her feel weak. Pt is not able to remember the name of the medication. Lying down in correct position and sleeping.  PRECAUTIONS: None  WEIGHT BEARING RESTRICTIONS No  FALLS:  Has patient fallen in last 6 months? No  LIVING ENVIRONMENT: Lives with:  lives with their family Lives in: House/apartment Able to access and be mobile within home   OCCUPATION: Nail technician  PLOF: Independent  PATIENT GOALS To decrease the pain  OBJECTIVE:   DIAGNOSTIC FINDINGS:  Cervical MRI completed 12 years ago   PATIENT SURVEYS:  NA  COGNITION: Overall cognitive status: Within functional limits for tasks assessed  SENSATION: WFL  for UEs and L lateral neck for light touch, pt's reports L neck tingling after ROM assessment  POSTURE: rounded shoulders, forward head, decreased lumbar lordosis, and increased thoracic kyphosis  PALPATION: TTP primarily of the L upper cervical and suboccipital area c note muscle tightness, to less of a degree with the lower cervical   CERVICAL ROM:   Active ROM A/PROM (deg) eval  Flexion 60, no pain  Extension 55, no pain  Right lateral flexion 45, no pain  Left lateral flexion 45, no pain  Right rotation 85, no pain  Left rotation 80, no pain   Pt reports the beginning sensation of tingling of her L neck after cervical ROM assessment  UPPER EXTREMITY ROM:   WNLs  UPPER EXTREMITY MMT: Myotome screen negative with equal strength L and R. UE Strength WNLs.  CERVICAL SPECIAL TESTS:  Spurling's test: Negative  TODAY'S TREATMENT:  Cervical rettraction x10 3" Scapular retraction x10 3"   PATIENT EDUCATION:  Education details: Eval findings, POC, HEP, proper sitting posture Person educated: Patient Education method: Explanation, Demonstration, Tactile cues, Verbal cues, and Handouts Education comprehension: verbalized understanding,  returned demonstration, verbal cues required, tactile cues required, and needs further education   HOME EXERCISE PROGRAM: Access Code: [email or web address] URL: [email or web address] Date: 05/12/2022 Prepared by: Joellyn Rued  Exercises - Seated Passive Cervical Retraction  - 8 x daily - 7 x weekly - 1 sets - 3 reps - 3 hold - Seated Scapular Retraction  - 8 x daily - 7 x weekly - 1 sets - 3 reps - 3 hold  ASSESSMENT:  CLINICAL IMPRESSION: Patient is a 51 y.o. F who was seen today for physical therapy evaluation and treatment for cervicalgia. Additionally, pt reports associated suboccipital and L head ram's horn pain.    OBJECTIVE IMPAIRMENTS increased muscle spasms, impaired UE functional use, postural dysfunction, and pain.   ACTIVITY LIMITATIONS  Pt lies down and limits overall activity when she experience neck pain and HA  PARTICIPATION LIMITATIONS:  Limits all activities when the neck pain and HA occurs  PERSONAL FACTORS Past/current experiences and Time since onset of injury/illness/exacerbation are also affecting patient's functional outcome.   REHAB POTENTIAL: Good  CLINICAL DECISION MAKING: Stable/uncomplicated  EVALUATION COMPLEXITY: Low   GOALS:  SHORT TERM GOALS: Target date: 06/02/2022   Pt will be Ind in an initial HEP Baseline: started on eval Goal status: INITIAL  2.  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  Pt will voice understanding of measures to assist in the reduction of pain Baseline:  Goal status: INITIAL    LONG TERM GOALS: Target date: 07/03/22  Pt will report a decrease in frequency of L neck pain and HA to 1 incident/2 weeks or less for improved QOL Baseline: every 3 day to 2 weeks Goal status: INITIAL  2.  Pt will report a decrease in the intensity of L neck pain and HA to </= 5/10 for improved QOL Baseline: 10/10  when occurs Goal status: INITIAL  3.  Pt  will be ind in a final HEP to maintain achieved LOF and QOL Baseline: strated on eval Goal status: INITIAL   PLAN: PT FREQUENCY: 2x/week  PT DURATION: 6 weeks  PLANNED INTERVENTIONS: Therapeutic exercises, Therapeutic activity, Patient/Family education, Dry Needling, Electrical stimulation, Spinal manipulation, Spinal mobilization, Cryotherapy, Moist heat, Taping, Traction, Ultrasound, Ionotophoresis 4mg /ml Dexamethasone, Manual therapy, and Re-evaluation  PLAN FOR NEXT SESSION: Review HEP and assess pt's response to. Modalities, manual care, and TPDN as indicated. Progress therex as indicated.   Sophea Rackham MS, PT 05/12/22 10:43 AM

## 2022-05-12 ENCOUNTER — Ambulatory Visit: Payer: 59 | Attending: Neurology

## 2022-05-12 DIAGNOSIS — R252 Cramp and spasm: Secondary | ICD-10-CM | POA: Diagnosis present

## 2022-05-12 DIAGNOSIS — M542 Cervicalgia: Secondary | ICD-10-CM | POA: Insufficient documentation

## 2022-05-12 DIAGNOSIS — R293 Abnormal posture: Secondary | ICD-10-CM | POA: Diagnosis present

## 2022-05-19 ENCOUNTER — Ambulatory Visit: Payer: 59 | Attending: Neurology

## 2022-05-19 NOTE — Therapy (Incomplete)
OUTPATIENT PHYSICAL THERAPY TREATMENT NOTE   Patient Name: Tammy Ali MRN: [id number] DOB:[birth year], 51 y.o., female Today's Date: 05/19/2022  PCP: Rema Fendt, NP REFERRING PROVIDER: Drema Dallas, DO  END OF SESSION:    Past Medical History:  Diagnosis Date   Anemia    GDM (gestational diabetes mellitus)    Hepatitis B carrier [facility])    Past Surgical History:  Procedure Laterality Date   BRAIN SURGERY     ?crainiotomy ?shunt placement- for headaches   Patient Active Problem List   Diagnosis Date Noted   Diabetes mellitus, type 2 (HCC) 01/20/2022   Active labor at term 06/13/2016    REFERRING DIAG: Cervicalgia  THERAPY DIAG:  No diagnosis found.  Rationale for Evaluation and Treatment Rehabilitation   OBJECTIVE: (objective measures completed at initial evaluation unless otherwise dated)   SUBJECTIVE:                                                                                                                                                                                               SUBJECTIVE STATEMENT: Pt reports no MOI, pain has been on/off for 10 years. When hurting unable to touch neck. Frequency varies from frequency 3 days to 2 weeks. Since starting the medication 2 weeks ago, pt has had only 1 episode of pain last Thursday at a lower level. Exs generally at gym 2-3 days a week    PERTINENT HISTORY:  DM   PAIN:  Are you having pain? Yes: NPRS scale: 0-10/10 Pain location: Pain of the L upper shoulder and neck and ram's horn HA.  Pain description: Throbbing,  Aggravating factors: Coughing, sneezing, Turning head to the L  Relieving factors: Pt is taking medication which is helpful, but makes her feel weak. Pt is not able to remember the name of the medication. Lying down in correct position and sleeping.   PRECAUTIONS: None   WEIGHT BEARING RESTRICTIONS No   FALLS:  Has patient fallen in last 6 months? No   LIVING ENVIRONMENT: Lives with:  lives with their family Lives in: House/apartment Able to access and be mobile within home     OCCUPATION: Advertising account planner   PLOF: Independent   PATIENT GOALS To decrease the pain   OBJECTIVE: (objective measures completed at initial evaluation unless otherwise dated)    DIAGNOSTIC FINDINGS:  Cervical MRI completed 12 years ago   PATIENT SURVEYS:  NA   COGNITION: Overall cognitive status: Within functional limits for tasks assessed   SENSATION: WFL  for UEs and L lateral neck for light touch, pt's reports L neck tingling after ROM assessment  POSTURE: rounded shoulders, forward head, decreased lumbar lordosis, and increased thoracic kyphosis   PALPATION: TTP primarily of the L upper cervical and suboccipital area c note muscle tightness, to less of a degree with the lower cervical     CERVICAL ROM:    Active ROM A/PROM (deg) eval  Flexion 60, no pain  Extension 55, no pain  Right lateral flexion 45, no pain  Left lateral flexion 45, no pain  Right rotation 85, no pain  Left rotation 80, no pain   Pt reports the beginning sensation of tingling of her L neck after cervical ROM assessment   UPPER EXTREMITY ROM:                       WNLs   UPPER EXTREMITY MMT: Myotome screen negative with equal strength L and R. UE Strength WNLs.   CERVICAL SPECIAL TESTS:  Spurling's test: Negative   TODAY'S TREATMENT:  Cervical rettraction x10 3" Scapular retraction x10 3"    PATIENT EDUCATION:  Education details: Eval findings, POC, HEP, proper sitting posture Person educated: Patient Education method: Explanation, Demonstration, Tactile cues, Verbal cues, and Handouts Education comprehension: verbalized understanding, returned demonstration, verbal cues required, tactile cues required, and needs further education     HOME EXERCISE PROGRAM: Access Code: [email or web address] URL: [email or web address] Date: 05/12/2022 Prepared by: Joellyn Rued   Exercises - Seated  Passive Cervical Retraction  - 8 x daily - 7 x weekly - 1 sets - 3 reps - 3 hold - Seated Scapular Retraction  - 8 x daily - 7 x weekly - 1 sets - 3 reps - 3 hold   ASSESSMENT:   CLINICAL IMPRESSION: Patient is a 51 y.o. F who was seen today for physical therapy evaluation and treatment for cervicalgia. Additionally, pt reports associated suboccipital and L head ram's horn pain.      OBJECTIVE IMPAIRMENTS increased muscle spasms, impaired UE functional use, postural dysfunction, and pain.    ACTIVITY LIMITATIONS  Pt lies down and limits overall activity when she experience neck pain and HA   PARTICIPATION LIMITATIONS:  Limits all activities when the neck pain and HA occurs   PERSONAL FACTORS Past/current experiences and Time since onset of injury/illness/exacerbation are also affecting patient's functional outcome.    REHAB POTENTIAL: Good   CLINICAL DECISION MAKING: Stable/uncomplicated   EVALUATION COMPLEXITY: Low     GOALS:   SHORT TERM GOALS: Target date: 06/02/2022    Pt will be Ind in an initial HEP Baseline: started on eval Goal status: INITIAL   2.  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  Pt will voice understanding of measures to assist in the reduction of pain Baseline:  Goal status: INITIAL       LONG TERM GOALS: Target date: 07/03/22   Pt will report a decrease in frequency of L neck pain and HA to 1 incident/2 weeks or less for improved QOL Baseline: every 3 day to 2 weeks Goal status: INITIAL   2.  Pt will report a decrease in the intensity of L neck pain and HA to </= 5/10 for improved QOL Baseline: 10/10 when occurs Goal status: INITIAL   3.  Pt will be ind in a final HEP to maintain achieved LOF and QOL Baseline: strated on eval Goal status: INITIAL     PLAN: PT FREQUENCY: 2x/week  PT DURATION: 6 weeks   PLANNED  INTERVENTIONS: Therapeutic exercises, Therapeutic activity, Patient/Family education, Dry Needling, Electrical stimulation, Spinal manipulation, Spinal mobilization, Cryotherapy, Moist heat, Taping, Traction, Ultrasound, Ionotophoresis 4mg /ml Dexamethasone, Manual therapy, and Re-evaluation   PLAN FOR NEXT SESSION: Review HEP and assess pt's response to. Modalities, manual care, and TPDN as indicated. Progress therex as indicated.    Arayah Krouse, PT 05/19/2022, 6:07 AM

## 2022-05-28 ENCOUNTER — Ambulatory Visit: Payer: 59

## 2022-06-01 ENCOUNTER — Ambulatory Visit: Payer: 59 | Admitting: Physical Therapy

## 2022-06-03 ENCOUNTER — Encounter: Payer: 59 | Admitting: Physical Therapy

## 2022-06-08 ENCOUNTER — Ambulatory Visit: Payer: 59 | Admitting: Physical Therapy

## 2022-07-07 NOTE — Progress Notes (Signed)
Erroneous encounter-disregard

## 2022-07-15 ENCOUNTER — Encounter: Payer: 59 | Admitting: Family

## 2022-07-15 DIAGNOSIS — E119 Type 2 diabetes mellitus without complications: Secondary | ICD-10-CM

## 2022-07-15 DIAGNOSIS — Z789 Other specified health status: Secondary | ICD-10-CM

## 2022-08-01 NOTE — Progress Notes (Signed)
Erroneous encounter-disregard

## 2022-08-10 ENCOUNTER — Encounter: Payer: 59 | Admitting: Family

## 2022-08-10 DIAGNOSIS — E119 Type 2 diabetes mellitus without complications: Secondary | ICD-10-CM

## 2022-08-10 DIAGNOSIS — Z789 Other specified health status: Secondary | ICD-10-CM

## 2022-08-18 NOTE — Progress Notes (Signed)
Patient ID: Tammy Ali, female    DOB: 01-27-1971  MRN: [id number]  CC: Chronic Care Management   Subjective: Tammy Ali is a 51 y.o. female who presents for chronic care management.   Her concerns today include:  Doing well on Metformin without issues or concerns. Checking blood sugars as needed. Request refills on daily multivitamin.   Patient Active Problem List   Diagnosis Date Noted   Dyspareunia in female 08/27/2022   Abnormal laboratory test result 08/27/2022   Amenorrhea 08/27/2022   Blood in urine 08/27/2022   Constipation 08/27/2022   Gestational diabetes mellitus 08/27/2022   Multigravida of advanced maternal age 54/14/2023   Pain in pelvis 08/27/2022   Pain of breast 08/27/2022   Pyuria 08/27/2022   Diabetes mellitus, type 2 (HCC) 01/20/2022   Active labor at term 06/13/2016   Chronic type B viral hepatitis (HCC) 03/23/2016     Current Outpatient Medications on File Prior to Visit  Medication Sig Dispense Refill   acetaminophen (TYLENOL) 500 MG tablet Take 500 mg by mouth every 6 (six) hours as needed.     gabapentin (NEURONTIN) 100 MG capsule Take 1 capsule at bedtime for one week, then 1 capsule twice daily for one week, then 2 capsules twice daily 120 capsule 0   meclizine (ANTIVERT) 12.5 MG tablet Take 1 tablet (12.5 mg total) by mouth 3 (three) times daily as needed for dizziness. (Patient not taking: Reported on 04/13/2022) 30 tablet 1   No current facility-administered medications on file prior to visit.    No Known Allergies  Social History   Socioeconomic History   Marital status: Legally Separated    Spouse name: Not on file   Number of children: Not on file   Years of education: Not on file   Highest education level: Not on file  Occupational History   Not on file  Tobacco Use   Smoking status: Never   Smokeless tobacco: Never  Vaping Use   Vaping Use: Never used  Substance and Sexual Activity   Alcohol use: No   Drug use: No   Sexual  activity: Yes    Birth control/protection: None  Other Topics Concern   Not on file  Social History Narrative   ** Merged History Encounter **       Social Determinants of Health   Financial Resource Strain: Not on file  Food Insecurity: Not on file  Transportation Needs: Not on file  Physical Activity: Not on file  Stress: Not on file  Social Connections: Not on file  Intimate Partner Violence: Not on file    Family History  Adopted: Yes    Past Surgical History:  Procedure Laterality Date   BRAIN SURGERY     ?crainiotomy ?shunt placement- for headaches    ROS: Review of Systems Negative except as stated above  PHYSICAL EXAM: BP 120/82 (BP Location: Left Arm, Patient Position: Sitting, Cuff Size: Large)   Pulse 88   Temp 98.3 F (36.8 C)   Resp 16   Ht 5' 2.99" (1.6 m)   Wt 177 lb (80.3 kg)   SpO2 95%   BMI 31.36 kg/m   Physical Exam HENT:     Head: Normocephalic and atraumatic.  Eyes:     Extraocular Movements: Extraocular movements intact.     Conjunctiva/sclera: Conjunctivae normal.     Pupils: Pupils are equal, round, and reactive to light.  Cardiovascular:     Rate and Rhythm: Normal rate and regular rhythm.  Pulses: Normal pulses.     Heart sounds: Normal heart sounds.  Pulmonary:     Effort: Pulmonary effort is normal.     Breath sounds: Normal breath sounds.  Musculoskeletal:     Cervical back: Normal range of motion and neck supple.  Neurological:     General: No focal deficit present.     Mental Status: She is alert and oriented to person, place, and time.  Psychiatric:        Mood and Affect: Mood normal.        Behavior: Behavior normal.     ASSESSMENT AND PLAN: 1. Type 2 diabetes mellitus without complication, without long-term current use of insulin (HCC) - Continue Metformin as prescribed.  - Multiple Vitamins-Minerals as prescribed.  - Discussed the importance of healthy eating habits, low-carbohydrate diet, low-sugar diet,  regular aerobic exercise (at least 150 minutes a week as tolerated) and medication compliance to achieve or maintain control of diabetes. - Sending hemoglobin A1c to lab, result pending.  - Follow-up with primary provider as scheduled.  - Hemoglobin A1c - metFORMIN (GLUCOPHAGE) 500 MG tablet; Take 1 tablet (500 mg total) by mouth 2 (two) times daily with a meal.  Dispense: 60 tablet; Refill: 2 - Multiple Vitamins-Minerals (MULTIVITAMIN) tablet; Take 1 tablet by mouth daily.  Dispense: 30 tablet; Refill: 2  2. Language barrier - [place] in-person interpreter, Tammy Ali.    Patient was given the opportunity to ask questions.  Patient verbalized understanding of the plan and was able to repeat key elements of the plan. Patient was given clear instructions to go to Emergency Department or return to medical center if symptoms don't improve, worsen, or new problems develop.The patient verbalized understanding.   Orders Placed This Encounter  Procedures   Hemoglobin A1c     Requested Prescriptions   Signed Prescriptions Disp Refills   metFORMIN (GLUCOPHAGE) 500 MG tablet 60 tablet 2    Sig: Take 1 tablet (500 mg total) by mouth 2 (two) times daily with a meal.   Multiple Vitamins-Minerals (MULTIVITAMIN) tablet 30 tablet 2    Sig: Take 1 tablet by mouth daily.    Return in about 3 months (around 11/26/2022) for Follow-Up or next available chronic care mgmt .  Rema Fendt, NP

## 2022-08-27 ENCOUNTER — Ambulatory Visit (INDEPENDENT_AMBULATORY_CARE_PROVIDER_SITE_OTHER): Payer: Commercial Managed Care - HMO | Admitting: Family

## 2022-08-27 VITALS — BP 120/82 | HR 88 | Temp 98.3°F | Resp 16 | Ht 62.99 in | Wt 177.0 lb

## 2022-08-27 DIAGNOSIS — R319 Hematuria, unspecified: Secondary | ICD-10-CM | POA: Insufficient documentation

## 2022-08-27 DIAGNOSIS — E119 Type 2 diabetes mellitus without complications: Secondary | ICD-10-CM

## 2022-08-27 DIAGNOSIS — R899 Unspecified abnormal finding in specimens from other organs, systems and tissues: Secondary | ICD-10-CM | POA: Insufficient documentation

## 2022-08-27 DIAGNOSIS — R8281 Pyuria: Secondary | ICD-10-CM | POA: Insufficient documentation

## 2022-08-27 DIAGNOSIS — Z789 Other specified health status: Secondary | ICD-10-CM | POA: Diagnosis not present

## 2022-08-27 DIAGNOSIS — N941 Unspecified dyspareunia: Secondary | ICD-10-CM | POA: Insufficient documentation

## 2022-08-27 DIAGNOSIS — N644 Mastodynia: Secondary | ICD-10-CM | POA: Insufficient documentation

## 2022-08-27 DIAGNOSIS — K5909 Other constipation: Secondary | ICD-10-CM | POA: Insufficient documentation

## 2022-08-27 DIAGNOSIS — N912 Amenorrhea, unspecified: Secondary | ICD-10-CM | POA: Insufficient documentation

## 2022-08-27 DIAGNOSIS — O09529 Supervision of elderly multigravida, unspecified trimester: Secondary | ICD-10-CM | POA: Insufficient documentation

## 2022-08-27 DIAGNOSIS — R102 Pelvic and perineal pain: Secondary | ICD-10-CM | POA: Insufficient documentation

## 2022-08-27 DIAGNOSIS — O24419 Gestational diabetes mellitus in pregnancy, unspecified control: Secondary | ICD-10-CM | POA: Insufficient documentation

## 2022-08-27 DIAGNOSIS — K5900 Constipation, unspecified: Secondary | ICD-10-CM | POA: Insufficient documentation

## 2022-08-27 MED ORDER — THERA VITAL M PO TABS: 1.0000 | ORAL_TABLET | Freq: Every day | ORAL | 2 refills | Status: DC

## 2022-08-27 MED ORDER — METFORMIN HCL 500 MG PO TABS: 500.0000 mg | ORAL_TABLET | Freq: Two times a day (BID) | ORAL | 2 refills | Status: DC

## 2022-08-27 NOTE — Patient Instructions (Signed)
Metformin Tablets What is this medication? METFORMIN (met FOR min) treats type 2 diabetes. It controls blood sugar (glucose) and helps your body use insulin effectively. This medication is often combined with changes to diet and exercise. This medicine may be used for other purposes; ask your health care provider or pharmacist if you have questions. COMMON BRAND NAME(S): Glucophage What should I tell my care team before I take this medication? They need to know if you have any of these conditions: Anemia Dehydration Heart disease If you often drink alcohol Kidney disease Liver disease Polycystic ovary syndrome Serious infection or injury Vomiting An unusual or allergic reaction to metformin, other medications, foods, dyes, or preservatives Pregnant or trying to get pregnant Breast-feeding How should I use this medication? Take this medication by mouth with a glass of water. Follow the directions on the prescription label. Take this medication with food. Take your medication at regular intervals. Do not take your medication more often than directed. Do not stop taking except on your care team's advice. Talk to your care team about the use of this medication in children. While this medication may be prescribed for children as young as 24 years of age for selected conditions, precautions do apply. Overdosage: If you think you have taken too much of this medicine contact a poison control center or emergency room at once. NOTE: This medicine is only for you. Do not share this medicine with others. What if I miss a dose? If you miss a dose, take it as soon as you can. If it is almost time for your next dose, take only that dose. Do not take double or extra doses. What may interact with this medication? Do not take this medication with any of the following: Certain contrast medications given before X-rays, CT scans, MRI, or other procedures Dofetilide This medication may also interact with the  following: Acetazolamide Alcohol Certain antivirals for HIV or hepatitis Certain medications for blood pressure, heart disease, irregular heart beat Cimetidine Dichlorphenamide Digoxin Diuretics Estrogens, progestins, or birth control pills Glycopyrrolate Isoniazid Lamotrigine Memantine Methazolamide Metoclopramide Midodrine Niacin Phenothiazines like chlorpromazine, mesoridazine, prochlorperazine, thioridazine Phenytoin Ranolazine Steroid medications like prednisone or cortisone Stimulant medications for attention disorders, weight loss, or to stay awake Thyroid medications Topiramate Trospium Vandetanib Zonisamide This list may not describe all possible interactions. Give your health care provider a list of all the medicines, herbs, non-prescription drugs, or dietary supplements you use. Also tell them if you smoke, drink alcohol, or use illegal drugs. Some items may interact with your medicine. What should I watch for while using this medication? Visit your care team for regular checks on your progress. A test called the HbA1C (A1C) will be monitored. This is a simple blood test. It measures your blood sugar control over the last 2 to 3 months. You will receive this test every 3 to 6 months. Using this medication with insulin or a sulfonylurea may increase your risk of hypoglycemia. Learn how to check your blood sugar. Learn the symptoms of low and high blood sugar and how to manage them. Always carry a quick-source of sugar with you in case you have symptoms of low blood sugar. Examples include hard sugar candy or glucose tablets. Make sure others know that you can choke if you eat or drink when you develop serious symptoms of low blood sugar, such as seizures or unconsciousness. They must get medical help at once. Tell your care team if you have high blood sugar. You might need  to change the dose of your medication. If you are sick or exercising more than usual, you might need  to change the dose of your medication. Do not skip meals. Ask your care team if you should avoid alcohol. Many nonprescription cough and cold products contain sugar or alcohol. These can affect blood sugar. This medication may cause ovulation in premenopausal women who do not have regular monthly periods. This may increase your chances of becoming pregnant. You should not take this medication if you become pregnant or think you may be pregnant. Talk with your care team about your birth control options while taking this medication. Contact your care team right away if you think you are pregnant. If you are going to need surgery, an MRI, CT scan, or other procedure, tell your care team that you are taking this medication. You may need to stop taking this medication before the procedure. Wear a medical ID bracelet or chain, and carry a card that describes your disease and details of your medication and dosage times. This medication may cause a decrease in folic acid and vitamin B12. You should make sure that you get enough vitamins while you are taking this medication. Discuss the foods you eat and the vitamins you take with your care team. What side effects may I notice from receiving this medication? Side effects that you should report to your care team as soon as possible: Allergic reactions--skin rash, itching, hives, swelling of the face, lips, tongue, or throat High lactic acid level--muscle pain or cramps, stomach pain, trouble breathing, general discomfort or fatigue Low vitamin B12 level--pain, tingling, or numbness in the hands or feet, muscle weakness, dizziness, confusion, difficulty concentrating Side effects that usually do not require medical attention (report to your care team if they continue or are bothersome): Diarrhea Gas Headache Metallic taste in mouth Nausea This list may not describe all possible side effects. Call your doctor for medical advice about side effects. You may  report side effects to FDA at 1-800-FDA-1088. Where should I keep my medication? Keep out of the reach of children and pets. Store at room temperature between 15 and 30 degrees C (59 and 86 degrees F). Protect from moisture and light. Get rid of any unused medication after the expiration date. To get rid of medications that are no longer needed or expired: Take the medication to a medication take-back program. Check with your pharmacy or law enforcement to find a location. If you cannot return the medication, check the label or package insert to see if the medication should be thrown out in the garbage or flushed down the toilet. If you are not sure, ask your care team. If it is safe to put in the trash, empty the medication out of the container. Mix the medication with cat litter, dirt, coffee grounds, or other unwanted substance. Seal the mixture in a bag or container. Put it in the trash. NOTE: This sheet is a summary. It may not cover all possible information. If you have questions about this medicine, talk to your doctor, pharmacist, or health care provider.  2023 Elsevier/Gold Standard (2020-10-11 00:00:00)

## 2022-08-27 NOTE — Progress Notes (Signed)
.  Pt presents for chronic care management   

## 2022-08-28 LAB — HEMOGLOBIN A1C
Est. average glucose Bld gHb Est-mCnc: 137 mg/dL
Hgb A1c MFr Bld: 6.4 % — ABNORMAL HIGH (ref 4.8–5.6)

## 2022-09-16 NOTE — Progress Notes (Deleted)
   NEUROLOGY CONSULTATION NOTE  Tammy Ali MRN: [id number] DOB: 1971/11/09  Referring provider: Durene Fruits, NP Primary care provider: Durene Fruits, NP  Reason for consult:  dizziness, headache  Assessment/Plan:   Left sided occipital neuralgia Left sided cervicalgia  Start gabapentin titrating to 200mg  twice daily.  We can increase dose if needed. Refer to physical therapy for neck pain and occipital neuralgia Follow up ***    Subjective:  Tammy Ali is a 51 year old female who follows up for left sided occipital neuralgia.  UPDATE: Started gabapentin and referred to PT. ***  Current medication:  gabapentin 200mg  BID  HISTORY: She has had a headache for the past 10 years.  She reports shooting pain in the left upper cervical paraspinal region that shoots up to top of head.  It is aggravated when palpating that left upper cervical region or with neck movement.  No radiating pain down the arm.  CT head on 09/25/2014 to evaluated this pain showed abnormal appearance of left side of sella and cavernous sinus.  However, follow up MRI of pituitary with and without contrast on 10/24/2014 was normal, indicating that the CT finding was related to partial volume artifact.    She had "brain surgery" 20 years ago.  Does not remember why.    Current medications:  acetaminophen      PAST MEDICAL HISTORY: Past Medical History:  Diagnosis Date   Anemia    GDM (gestational diabetes mellitus)    Hepatitis B carrier ([place])     PAST SURGICAL HISTORY: Past Surgical History:  Procedure Laterality Date   BRAIN SURGERY     ?crainiotomy ?shunt placement- for headaches    MEDICATIONS: Current Outpatient Medications on File Prior to Visit  Medication Sig Dispense Refill   acetaminophen (TYLENOL) 500 MG tablet Take 500 mg by mouth every 6 (six) hours as needed.     gabapentin (NEURONTIN) 100 MG capsule Take 1 capsule at bedtime for one week, then 1 capsule twice daily for one week, then  2 capsules twice daily 120 capsule 0   meclizine (ANTIVERT) 12.5 MG tablet Take 1 tablet (12.5 mg total) by mouth 3 (three) times daily as needed for dizziness. (Patient not taking: Reported on 04/13/2022) 30 tablet 1   metFORMIN (GLUCOPHAGE) 500 MG tablet Take 1 tablet (500 mg total) by mouth 2 (two) times daily with a meal. 60 tablet 2   Multiple Vitamins-Minerals (MULTIVITAMIN) tablet Take 1 tablet by mouth daily. 30 tablet 2   No current facility-administered medications on file prior to visit.    ALLERGIES: No Known Allergies  FAMILY HISTORY: Family History  Adopted: Yes    Objective:  *** General: No acute distress.  Patient appears well-groomed.   Head:  Normocephalic/atraumatic Eyes:  fundi examined but not visualized Neck: supple, left sided suboccipital and upper cervical paraspinal tenderness, full range of motion Heart: regular rate and rhythm Neurological Exam:  ***    Thank you for allowing me to take part in the care of this patient.  Metta Clines, DO  CC: Durene Fruits, NP

## 2022-09-21 ENCOUNTER — Ambulatory Visit: Payer: 59 | Admitting: Neurology

## 2022-11-17 NOTE — Progress Notes (Signed)
Patient ID: Tammy Ali, female    DOB: 1971-08-21  MRN: [id number]  CC: Chronic Care Management   Subjective: Tammy Ali is a 51 y.o. female who presents for chronic care management. She is accompanied by her husband and daughter.   Her concerns today include:  Since last visit has not take Metformin due to goal of wanting to eventually stop taking the same. No further issues/concerns today.   Patient Active Problem List   Diagnosis Date Noted   Dyspareunia in female 08/27/2022   Abnormal laboratory test result 08/27/2022   Amenorrhea 08/27/2022   Blood in urine 08/27/2022   Constipation 08/27/2022   Gestational diabetes mellitus 08/27/2022   Multigravida of advanced maternal age 58/14/2023   Pain in pelvis 08/27/2022   Pain of breast 08/27/2022   Pyuria 08/27/2022   Diabetes mellitus, type 2 (HCC) 01/20/2022   Active labor at term 06/13/2016   Chronic type B viral hepatitis (HCC) 03/23/2016     Current Outpatient Medications on File Prior to Visit  Medication Sig Dispense Refill   acetaminophen (TYLENOL) 500 MG tablet Take 500 mg by mouth every 6 (six) hours as needed.     gabapentin (NEURONTIN) 100 MG capsule Take 1 capsule at bedtime for one week, then 1 capsule twice daily for one week, then 2 capsules twice daily 120 capsule 0   meclizine (ANTIVERT) 12.5 MG tablet Take 1 tablet (12.5 mg total) by mouth 3 (three) times daily as needed for dizziness. (Patient not taking: Reported on 04/13/2022) 30 tablet 1   metFORMIN (GLUCOPHAGE) 500 MG tablet Take 1 tablet (500 mg total) by mouth 2 (two) times daily with a meal. 60 tablet 2   Multiple Vitamins-Minerals (MULTIVITAMIN) tablet Take 1 tablet by mouth daily. 30 tablet 2   No current facility-administered medications on file prior to visit.    No Known Allergies  Social History   Socioeconomic History   Marital status: Legally Separated    Spouse name: Not on file   Number of children: Not on file   Years of education:  Not on file   Highest education level: Not on file  Occupational History   Not on file  Tobacco Use   Smoking status: Never   Smokeless tobacco: Never  Vaping Use   Vaping Use: Never used  Substance and Sexual Activity   Alcohol use: No   Drug use: No   Sexual activity: Yes    Birth control/protection: None  Other Topics Concern   Not on file  Social History Narrative   ** Merged History Encounter **       Social Determinants of Health   Financial Resource Strain: Not on file  Food Insecurity: Not on file  Transportation Needs: Not on file  Physical Activity: Not on file  Stress: Not on file  Social Connections: Not on file  Intimate Partner Violence: Not on file    Family History  Adopted: Yes    Past Surgical History:  Procedure Laterality Date   BRAIN SURGERY     ?crainiotomy ?shunt placement- for headaches    ROS: Review of Systems Negative except as stated above  PHYSICAL EXAM: BP 121/83   Pulse (!) 59   Wt 186 lb 12.8 oz (84.7 kg)   SpO2 98%   BMI 33.10 kg/m   Physical Exam HENT:     Head: Normocephalic and atraumatic.  Eyes:     Extraocular Movements: Extraocular movements intact.     Conjunctiva/sclera: Conjunctivae normal.  Pupils: Pupils are equal, round, and reactive to light.  Cardiovascular:     Rate and Rhythm: Bradycardia present.     Pulses: Normal pulses.     Heart sounds: Normal heart sounds.  Pulmonary:     Effort: Pulmonary effort is normal.     Breath sounds: Normal breath sounds.  Musculoskeletal:     Cervical back: Normal range of motion and neck supple.  Neurological:     General: No focal deficit present.     Mental Status: She is alert and oriented to person, place, and time.  Psychiatric:        Mood and Affect: Mood normal.        Behavior: Behavior normal.     ASSESSMENT AND PLAN: 1. Type 2 diabetes mellitus without complication, without long-term current use of insulin (HCC) - Patient reports she has not  taken Metformin since last visit per her preference.  - Discussed the importance of healthy eating habits, low-carbohydrate diet, low-sugar diet, regular aerobic exercise (at least 150 minutes a week as tolerated) and medication compliance to achieve or maintain control of diabetes. - Update hemoglobin A1c and BMP today. Results pending.  - Follow-up with primary provider in 3 months or sooner if needed. - Basic Metabolic Panel - Hemoglobin A1c  2. Diabetic eye exam [facility]) - Referral to Ophthalmology for further evaluation/management.  - Ambulatory referral to Ophthalmology  3. Language barrier - [employer] interpreter, Threasa Beards.   Patient was given the opportunity to ask questions.  Patient verbalized understanding of the plan and was able to repeat key elements of the plan. Patient was given clear instructions to go to Emergency Department or return to medical center if symptoms don't improve, worsen, or new problems develop.The patient verbalized understanding.   Orders Placed This Encounter  Procedures   Basic Metabolic Panel   Hemoglobin A1c   Ambulatory referral to Ophthalmology     Return in about 3 months (around 02/23/2023) for Follow-Up or next available chronic care mgmt .  Rema Fendt, NP

## 2022-11-24 ENCOUNTER — Encounter: Payer: Self-pay | Admitting: Family

## 2022-11-24 ENCOUNTER — Ambulatory Visit (INDEPENDENT_AMBULATORY_CARE_PROVIDER_SITE_OTHER): Payer: Commercial Managed Care - HMO | Admitting: Family

## 2022-11-24 VITALS — BP 121/83 | HR 59 | Wt 186.8 lb

## 2022-11-24 DIAGNOSIS — E119 Type 2 diabetes mellitus without complications: Secondary | ICD-10-CM

## 2022-11-24 DIAGNOSIS — Z0100 Encounter for examination of eyes and vision without abnormal findings: Secondary | ICD-10-CM

## 2022-11-24 DIAGNOSIS — Z789 Other specified health status: Secondary | ICD-10-CM

## 2022-11-24 NOTE — Progress Notes (Signed)
.  Pt presents for chronic care management   

## 2022-11-25 ENCOUNTER — Other Ambulatory Visit: Payer: Self-pay | Admitting: Family

## 2022-11-25 DIAGNOSIS — E119 Type 2 diabetes mellitus without complications: Secondary | ICD-10-CM

## 2022-11-25 LAB — BASIC METABOLIC PANEL
BUN/Creatinine Ratio: 15 (ref 9–23)
BUN: 11 mg/dL (ref 6–24)
CO2: 23 mmol/L (ref 20–29)
Calcium: 8.9 mg/dL (ref 8.7–10.2)
Chloride: 106 mmol/L (ref 96–106)
Creatinine, Ser: 0.71 mg/dL (ref 0.57–1.00)
Glucose: 94 mg/dL (ref 70–99)
Potassium: 4.6 mmol/L (ref 3.5–5.2)
Sodium: 140 mmol/L (ref 134–144)
eGFR: 103 mL/min/{1.73_m2} (ref >59)

## 2022-11-25 LAB — HEMOGLOBIN A1C
Est. average glucose Bld gHb Est-mCnc: 137 mg/dL
Hgb A1c MFr Bld: 6.4 % — ABNORMAL HIGH (ref 4.8–5.6)

## 2022-11-25 MED ORDER — METFORMIN HCL 500 MG PO TABS: 500.0000 mg | ORAL_TABLET | Freq: Two times a day (BID) | ORAL | 2 refills | Status: DC

## 2023-02-16 NOTE — Progress Notes (Signed)
Patient ID: Tammy Ali, female    DOB: 1971/09/11  MRN: [id number]  CC: Chronic Care Management   Subjective: Tammy Ali is a 52 y.o. female who presents for chronic care management. She is accompanied by her husband and daughter.   Her concerns today include:  - Reports she is not taking Metformin per her preference.  - Intermittent bilateral ankle edema at the end of day and resolves overnight with rest. She does work a Warden/ranger job. She tries to get up and walk around sometimes during her workday. She denies associated red flag symptoms. I discussed with patient in detail conservative measures such as but not limited to monitoring salt intake, adequate water intake, elevation of lower extremities, use of compression stockings, and red flag symptoms to watch for. Patient aware to follow-up with primary care as scheduled.    Patient Active Problem List   Diagnosis Date Noted   Dyspareunia in female 08/27/2022   Abnormal laboratory test result 08/27/2022   Amenorrhea 08/27/2022   Blood in urine 08/27/2022   Constipation 08/27/2022   Gestational diabetes mellitus 08/27/2022   Multigravida of advanced maternal age 64/14/2023   Pain in pelvis 08/27/2022   Pain of breast 08/27/2022   Pyuria 08/27/2022   Diabetes mellitus, type 2 ([place]) 01/20/2022   Active labor at term 06/13/2016   Chronic type B viral hepatitis ([place]) 03/23/2016     Current Outpatient Medications on File Prior to Visit  Medication Sig Dispense Refill   acetaminophen (TYLENOL) 500 MG tablet Take 500 mg by mouth every 6 (six) hours as needed.     gabapentin (NEURONTIN) 100 MG capsule Take 1 capsule at bedtime for one week, then 1 capsule twice daily for one week, then 2 capsules twice daily 120 capsule 0   meclizine (ANTIVERT) 12.5 MG tablet Take 1 tablet (12.5 mg total) by mouth 3 (three) times daily as needed for dizziness. (Patient not taking: Reported on 04/13/2022) 30 tablet 1   Multiple Vitamins-Minerals  (MULTIVITAMIN) tablet Take 1 tablet by mouth daily. 30 tablet 2   No current facility-administered medications on file prior to visit.    No Known Allergies  Social History   Socioeconomic History   Marital status: Legally Separated    Spouse name: Not on file   Number of children: Not on file   Years of education: Not on file   Highest education level: Not on file  Occupational History   Not on file  Tobacco Use   Smoking status: Never    Passive exposure: Never   Smokeless tobacco: Never  Vaping Use   Vaping Use: Never used  Substance and Sexual Activity   Alcohol use: No   Drug use: No   Sexual activity: Yes    Birth control/protection: None  Other Topics Concern   Not on file  Social History Narrative   ** Merged History Encounter **       Social Determinants of Health   Financial Resource Strain: Not on file  Food Insecurity: Not on file  Transportation Needs: Not on file  Physical Activity: Not on file  Stress: Not on file  Social Connections: Not on file  Intimate Partner Violence: Not on file    Family History  Adopted: Yes    Past Surgical History:  Procedure Laterality Date   BRAIN SURGERY     ?crainiotomy ?shunt placement- for headaches    ROS: Review of Systems Negative except as stated above  PHYSICAL EXAM: BP  126/85 (BP Location: Left Arm, Patient Position: Sitting, Cuff Size: Large)   Pulse (!) 58   Temp 98.3 F (36.8 C)   Resp 16   Ht 5' 2.99" (1.6 m)   Wt 184 lb (83.5 kg)   SpO2 98%   BMI 32.60 kg/m   Physical Exam HENT:     Head: Normocephalic and atraumatic.  Eyes:     Extraocular Movements: Extraocular movements intact.     Conjunctiva/sclera: Conjunctivae normal.     Pupils: Pupils are equal, round, and reactive to light.  Cardiovascular:     Rate and Rhythm: Bradycardia present.  Pulmonary:     Effort: Pulmonary effort is normal.     Breath sounds: Normal breath sounds.  Musculoskeletal:     Cervical back:  Normal range of motion and neck supple.     Right hip: Normal.     Left hip: Normal.     Right upper leg: Normal.     Left upper leg: Normal.     Right knee: Normal.     Left knee: Normal.     Right lower leg: Normal.     Left lower leg: Normal.     Right ankle: Normal.     Left ankle: Normal.     Right foot: Normal.     Left foot: Normal.  Neurological:     General: No focal deficit present.     Mental Status: She is alert and oriented to person, place, and time.  Psychiatric:        Mood and Affect: Mood normal.        Behavior: Behavior normal.     Results for orders placed or performed in visit on 02/23/23  POCT glycosylated hemoglobin (Hb A1C)  Result Value Ref Range   Hemoglobin A1C     HbA1c POC (<> result, manual entry)     HbA1c, POC (prediabetic range)     HbA1c, POC (controlled diabetic range) 6.6 0.0 - 7.0 %    ASSESSMENT AND PLAN: 1. Type 2 diabetes mellitus without complication, without long-term current use of insulin (HCC) - Hemoglobin A1c at goal at 6.6%, goal 7%. This is increased from previous 6.4%.  - Patient reports she does not take Metformin per her preference. I discussed with patient in detail to consider beginning Metformin due to today's hemoglobin A1c increased compared to previous.  - Discussed the importance of healthy eating habits, low-carbohydrate diet, low-sugar diet, regular aerobic exercise (at least 150 minutes a week as tolerated) and medication compliance to achieve or maintain control of diabetes. - Follow-up with primary provider in 6 months or sooner if needed.  - POCT glycosylated hemoglobin (Hb A1C) - metFORMIN (GLUCOPHAGE) 500 MG tablet; Take 1 tablet (500 mg total) by mouth 2 (two) times daily with a meal.  Dispense: 60 tablet; Refill: 2  2. Language barrier - Patient is accompanied by her daughter, Hbe, who serves as interpreter and part-historian.   Patient was given the opportunity to ask questions.  Patient verbalized  understanding of the plan and was able to repeat key elements of the plan. Patient was given clear instructions to go to Emergency Department or return to medical center if symptoms don't improve, worsen, or new problems develop.The patient verbalized understanding.   Orders Placed This Encounter  Procedures   POCT glycosylated hemoglobin (Hb A1C)     Requested Prescriptions   Signed Prescriptions Disp Refills   metFORMIN (GLUCOPHAGE) 500 MG tablet 60 tablet 2  Sig: Take 1 tablet (500 mg total) by mouth 2 (two) times daily with a meal.    Return in about 6 months (around 08/26/2023) for Follow-Up or next available chronic care mgmt .  Camillia Herter, NP

## 2023-02-23 ENCOUNTER — Encounter: Payer: Self-pay | Admitting: Family

## 2023-02-23 ENCOUNTER — Ambulatory Visit (INDEPENDENT_AMBULATORY_CARE_PROVIDER_SITE_OTHER): Payer: Self-pay | Admitting: Family

## 2023-02-23 VITALS — BP 126/85 | HR 58 | Temp 98.3°F | Resp 16 | Ht 62.99 in | Wt 184.0 lb

## 2023-02-23 DIAGNOSIS — E119 Type 2 diabetes mellitus without complications: Secondary | ICD-10-CM

## 2023-02-23 DIAGNOSIS — Z789 Other specified health status: Secondary | ICD-10-CM

## 2023-02-23 LAB — POCT GLYCOSYLATED HEMOGLOBIN (HGB A1C): HbA1c, POC (controlled diabetic range): 6.6 % (ref 0.0–7.0)

## 2023-02-23 MED ORDER — METFORMIN HCL 500 MG PO TABS: 500.0000 mg | ORAL_TABLET | Freq: Two times a day (BID) | ORAL | 2 refills | Status: DC

## 2023-02-23 NOTE — Progress Notes (Signed)
.  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

## 2023-05-01 ENCOUNTER — Ambulatory Visit
Admission: EM | Admit: 2023-05-01 | Discharge: 2023-05-01 | Disposition: A | Discharge status: Home / Self Care | Payer: PRIVATE HEALTH INSURANCE | Attending: Internal Medicine | Admitting: Internal Medicine

## 2023-05-01 DIAGNOSIS — J069 Acute upper respiratory infection, unspecified: Secondary | ICD-10-CM | POA: Insufficient documentation

## 2023-05-01 DIAGNOSIS — J029 Acute pharyngitis, unspecified: Secondary | ICD-10-CM | POA: Insufficient documentation

## 2023-05-01 DIAGNOSIS — B001 Herpesviral vesicular dermatitis: Secondary | ICD-10-CM | POA: Diagnosis not present

## 2023-05-01 LAB — POCT RAPID STREP A (OFFICE): Rapid Strep A Screen: NEGATIVE

## 2023-05-01 MED ORDER — VALACYCLOVIR HCL 1 G PO TABS: 1000.0000 mg | ORAL_TABLET | Freq: Two times a day (BID) | ORAL | 0 refills | Status: AC

## 2023-05-01 MED ORDER — BENZONATATE 100 MG PO CAPS: 100.0000 mg | ORAL_CAPSULE | Freq: Three times a day (TID) | ORAL | 0 refills | Status: DC | PRN

## 2023-05-01 MED ORDER — FLUTICASONE PROPIONATE 50 MCG/ACT NA SUSP: 1.0000 | Freq: Every day | NASAL | 0 refills | Status: DC

## 2023-05-01 NOTE — ED Triage Notes (Signed)
Pt c/o cough, that progressed into headache, sore throat, and blisters around mouth.   Onset ~ 3 days ago

## 2023-05-01 NOTE — ED Provider Notes (Signed)
[facility]    CSN: [id number] Arrival date & time: 05/01/23  0901      History   Chief Complaint Chief Complaint  Patient presents with   Cough    HPI Tammy Ali is a 52 y.o. female.   Patient presents with family member who is interpreting per patient's choice.  Family member reports cough, nasal congestion, ear discomfort, sore throat, lesion to left lower lip that all started about 3 days ago.  Reports husband has had similar symptoms.  Patient is not sure of Tmax at home but states that she felt feverish.  Reports that she has intermittent similar lesions of her lip that typically resolve on their own.  She has never been evaluated for it.  Denies chest pain, shortness of breath, gastrointestinal symptoms.  Patient has had Tylenol for symptoms.  Denies history of asthma or COPD and patient does not smoke cigarettes.   Cough   Past Medical History:  Diagnosis Date   Anemia    GDM (gestational diabetes mellitus)    Hepatitis B carrier [facility])     Patient Active Problem List   Diagnosis Date Noted   Dyspareunia in female 08/27/2022   Abnormal laboratory test result 08/27/2022   Amenorrhea 08/27/2022   Blood in urine 08/27/2022   Constipation 08/27/2022   Gestational diabetes mellitus 08/27/2022   Multigravida of advanced maternal age 75/14/2023   Pain in pelvis 08/27/2022   Pain of breast 08/27/2022   Pyuria 08/27/2022   Diabetes mellitus, type 2 (HCC) 01/20/2022   Active labor at term 06/13/2016   Chronic type B viral hepatitis (HCC) 03/23/2016    Past Surgical History:  Procedure Laterality Date   BRAIN SURGERY     ?crainiotomy ?shunt placement- for headaches    OB History     Gravida  7   Para  6   Term  5   Preterm  1   AB  1   Living  1      SAB  1   IAB  0   Ectopic  0   Multiple      Live Births  1            Home Medications    Prior to Admission medications   Medication Sig Start Date End Date Taking?  Authorizing Provider  benzonatate (TESSALON) 100 MG capsule Take 1 capsule (100 mg total) by mouth every 8 (eight) hours as needed for cough. 05/01/23  Yes Shalah Estelle, Rolly Salter E, FNP  fluticasone (FLONASE) 50 MCG/ACT nasal spray Place 1 spray into both nostrils daily. 05/01/23  Yes Makeshia Seat, Acie Fredrickson, FNP  valACYclovir (VALTREX) 1000 MG tablet Take 1 tablet (1,000 mg total) by mouth 2 (two) times daily for 7 days. 05/01/23 05/08/23 Yes Hae Ahlers, Acie Fredrickson, FNP  acetaminophen (TYLENOL) 500 MG tablet Take 500 mg by mouth every 6 (six) hours as needed.    [provider]  gabapentin (NEURONTIN) 100 MG capsule Take 1 capsule at bedtime for one week, then 1 capsule twice daily for one week, then 2 capsules twice daily 04/13/22   Drema Dallas, DO  meclizine (ANTIVERT) 12.5 MG tablet Take 1 tablet (12.5 mg total) by mouth 3 (three) times daily as needed for dizziness. Patient not taking: Reported on 04/13/2022 01/19/22   Rema Fendt, NP  metFORMIN (GLUCOPHAGE) 500 MG tablet Take 1 tablet (500 mg total) by mouth 2 (two) times daily with a meal. 02/23/23 05/24/23  Rema Fendt, NP  Multiple Vitamins-Minerals (MULTIVITAMIN) tablet Take 1 tablet by mouth daily. 08/27/22   Rema Fendt, NP    Family History Family History  Adopted: Yes    Social History Social History   Tobacco Use   Smoking status: Never    Passive exposure: Never   Smokeless tobacco: Never  Vaping Use   Vaping Use: Never used  Substance Use Topics   Alcohol use: No   Drug use: No     Allergies   Patient has no known allergies.   Review of Systems Review of Systems Per HPI  Physical Exam Triage Vital Signs ED Triage Vitals  Enc Vitals Group     BP 05/01/23 0912 131/80     Pulse Rate 05/01/23 0912 73     Resp 05/01/23 0912 16     Temp 05/01/23 0912 98.4 F (36.9 C)     Temp Source 05/01/23 0912 Oral     SpO2 05/01/23 0912 96 %     Weight --      Height --      Head Circumference --      Peak Flow --      Pain Score  05/01/23 0913 8     Pain Loc --      Pain Edu? --      Excl. in GC? --    No data found.  Updated Vital Signs BP 131/80 (BP Location: Left Arm)   Pulse 73   Temp 98.4 F (36.9 C) (Oral)   Resp 16   SpO2 96%   Visual Acuity Right Eye Distance:   Left Eye Distance:   Bilateral Distance:    Right Eye Near:   Left Eye Near:    Bilateral Near:     Physical Exam Constitutional:      General: She is not in acute distress.    Appearance: Normal appearance. She is not toxic-appearing or diaphoretic.  HENT:     Head: Normocephalic and atraumatic.     Right Ear: Tympanic membrane and ear canal normal.     Left Ear: Tympanic membrane and ear canal normal.     Nose: Congestion present.     Mouth/Throat:     Mouth: Mucous membranes are moist.     Pharynx: Posterior oropharyngeal erythema present.     Comments: Patient has small vesicular lesion present to left lower lip. Eyes:     Extraocular Movements: Extraocular movements intact.     Conjunctiva/sclera: Conjunctivae normal.     Pupils: Pupils are equal, round, and reactive to light.  Cardiovascular:     Rate and Rhythm: Normal rate and regular rhythm.     Pulses: Normal pulses.     Heart sounds: Normal heart sounds.  Pulmonary:     Effort: Pulmonary effort is normal. No respiratory distress.     Breath sounds: Normal breath sounds. No stridor. No wheezing, rhonchi or rales.  Abdominal:     General: Abdomen is flat. Bowel sounds are normal.     Palpations: Abdomen is soft.  Musculoskeletal:        General: Normal range of motion.     Cervical back: Normal range of motion.  Skin:    General: Skin is warm and dry.  Neurological:     General: No focal deficit present.     Mental Status: She is alert and oriented to person, place, and time. Mental status is at baseline.  Psychiatric:        Mood and Affect: Mood  normal.        Behavior: Behavior normal.      UC Treatments / Results  Labs (all labs ordered are  listed, but only abnormal results are displayed) Labs Reviewed  HSV CULTURE AND TYPING  CULTURE, GROUP A STREP [facility])  POCT RAPID STREP A (OFFICE)    EKG   Radiology No results found.  Procedures Procedures (including critical care time)  Medications Ordered in UC Medications - No data to display  Initial Impression / Assessment and Plan / UC Course  I have reviewed the triage vital signs and the nursing notes.  Pertinent labs & imaging results that were available during my care of the patient were reviewed by me and considered in my medical decision making (see chart for details).     Viral upper respiratory infection  Patient presents with symptoms likely from a viral upper respiratory infection.  Do not suspect underlying cardiopulmonary process. Symptoms seem unlikely related to ACS, CHF or COPD exacerbations, pneumonia, pneumothorax. Patient is nontoxic appearing and not in need of emergent medical intervention.  Rapid strep is negative.  Throat culture pending.  Patient declined COVID testing. Recommended symptom control with medications and supportive care.  Patient was sent Flonase and benzonatate.  2.  Lip lesion  Lip lesion appears consistent with cold sore.  No signs of bacterial infection on exam.  Will treat with Valtrex.  HSV swab pending.  Advised strict follow-up if any symptoms persist or worsen.  Return if symptoms fail to improve in 1-2 weeks or you develop shortness of breath, chest pain, severe headache. Patient states understanding and is agreeable.  Discharged with PCP followup.  Final Clinical Impressions(s) / UC Diagnoses   Final diagnoses:  Cold sore  Viral upper respiratory tract infection with cough  Sore throat     Discharge Instructions      You have a viral illness which should run its course as we discussed.  I have prescribed you 2 medications to help alleviate cough, congestion, ear discomfort.  Rapid strep is negative.  Throat  culture is pending.  Will call if it is abnormal.  Your lip lesion appears consistent with a cold sore which is also viral.  I have prescribed an antiviral medication to help alleviate this.  Swab for your lip is pending.  We will call if it is positive.  Follow-up if any symptoms persist or worsen.     ED Prescriptions     Medication Sig Dispense Auth. Provider   valACYclovir (VALTREX) 1000 MG tablet Take 1 tablet (1,000 mg total) by mouth 2 (two) times daily for 7 days. 14 tablet [place], Avalon E, [place]   fluticasone [facility]) 50 MCG/ACT nasal spray Place 1 spray into both nostrils daily. 16 g Adi Doro, Rolly Salter E, [place]   benzonatate (TESSALON) 100 MG capsule Take 1 capsule (100 mg total) by mouth every 8 (eight) hours as needed for cough. 21 capsule [place], Acie Fredrickson, [place]      PDMP not reviewed this encounter.   Gustavus Bryant, [place] 05/01/23 1012

## 2023-05-01 NOTE — Discharge Instructions (Signed)
You have a viral illness which should run its course as we discussed.  I have prescribed you 2 medications to help alleviate cough, congestion, ear discomfort.  Rapid strep is negative.  Throat culture is pending.  Will call if it is abnormal.  Your lip lesion appears consistent with a cold sore which is also viral.  I have prescribed an antiviral medication to help alleviate this.  Swab for your lip is pending.  We will call if it is positive.  Follow-up if any symptoms persist or worsen.

## 2023-05-02 LAB — CULTURE, GROUP A STREP (THRC)

## 2023-05-04 ENCOUNTER — Ambulatory Visit
Admission: EM | Admit: 2023-05-04 | Discharge: 2023-05-04 | Disposition: A | Discharge status: Home / Self Care | Payer: PRIVATE HEALTH INSURANCE | Attending: Internal Medicine | Admitting: Internal Medicine

## 2023-05-04 DIAGNOSIS — E1169 Type 2 diabetes mellitus with other specified complication: Secondary | ICD-10-CM | POA: Insufficient documentation

## 2023-05-04 DIAGNOSIS — Z1152 Encounter for screening for COVID-19: Secondary | ICD-10-CM | POA: Diagnosis not present

## 2023-05-04 DIAGNOSIS — N3001 Acute cystitis with hematuria: Secondary | ICD-10-CM | POA: Diagnosis present

## 2023-05-04 DIAGNOSIS — Z3202 Encounter for pregnancy test, result negative: Secondary | ICD-10-CM | POA: Diagnosis not present

## 2023-05-04 DIAGNOSIS — J069 Acute upper respiratory infection, unspecified: Secondary | ICD-10-CM | POA: Insufficient documentation

## 2023-05-04 DIAGNOSIS — R519 Headache, unspecified: Secondary | ICD-10-CM | POA: Insufficient documentation

## 2023-05-04 LAB — POCT URINALYSIS DIP (MANUAL ENTRY)
Bilirubin, UA: NEGATIVE
Glucose, UA: NEGATIVE mg/dL
Ketones, POC UA: NEGATIVE mg/dL
Nitrite, UA: NEGATIVE
Protein Ur, POC: NEGATIVE mg/dL
Spec Grav, UA: 1.015 (ref 1.010–1.025)
Urobilinogen, UA: 0.2 E.U./dL
pH, UA: 5.5 (ref 5.0–8.0)

## 2023-05-04 LAB — CULTURE, GROUP A STREP (THRC)

## 2023-05-04 LAB — HSV CULTURE AND TYPING

## 2023-05-04 LAB — POCT FASTING CBG KUC MANUAL ENTRY: POCT Glucose (KUC): 130 mg/dL — AB (ref 70–99)

## 2023-05-04 LAB — POCT URINE PREGNANCY: Preg Test, Ur: NEGATIVE

## 2023-05-04 MED ORDER — ONDANSETRON 4 MG PO TBDP
4.0000 mg | ORAL_TABLET | Freq: Once | ORAL | Status: AC
  Administered 2023-05-04: 4 mg via ORAL

## 2023-05-04 MED ORDER — ONDANSETRON 4 MG PO TBDP: 4.0000 mg | ORAL_TABLET | Freq: Three times a day (TID) | ORAL | 0 refills | Status: DC | PRN

## 2023-05-04 MED ORDER — KETOROLAC TROMETHAMINE 30 MG/ML IJ SOLN
30.0000 mg | Freq: Once | INTRAMUSCULAR | Status: AC
  Administered 2023-05-04: 30 mg via INTRAMUSCULAR

## 2023-05-04 MED ORDER — CEPHALEXIN 500 MG PO CAPS: 500.0000 mg | ORAL_CAPSULE | Freq: Two times a day (BID) | ORAL | 0 refills | Status: AC

## 2023-05-04 NOTE — Discharge Instructions (Addendum)
Your symptoms are likely due to a viral upper respiratory tract infection as well as a urinary tract infection.  Urine pregnancy test is negative.  I have tested you for COVID today and this test will come back in the next 12 to 24 hours.  I will call you if your test is positive and send an antiviral treatment at that time if necessary.  Continue using medications sent in by previous provider 3 days ago (Flonase and [employer]) as needed for cough and congestion.  You may also purchase Zyrtec and use this over-the-counter to help dry up your nasal drainage.  Your urine shows that you have a urinary tract infection which is what I believe is contributing to your nausea and vomiting/abdominal discomfort.  Please start taking Keflex antibiotic twice daily for the next 7 days to treat this urinary tract infection.  I gave you a medication called Zofran in the clinic to help with your nausea and vomiting. You may take this every 8 hours as needed for nausea and vomiting.  I gave you a shot of ketorolac (anti-inflammatory medication) for your headache. You may take Tylenol at home as needed for your headache. Do not take any ibuprofen for the next 24 hours since the ketorolac shot I gave you is similar to ibuprofen.  Slowly start sipping on fluids until you are able to keep fluids down without vomiting, then increase your diet to bananas, toast, rice, applesauce, and other bland foods. After you are able to keep down bland foods without vomiting, you may increase your diet back to normal as tolerated.  Please schedule an appointment with your primary care provider in the next 1 to 2 weeks for recheck.  If you develop any new or worsening symptoms or do not improve in the next 2 to 3 days, please return.  If your symptoms are severe, please go to the emergency room.  Follow-up with your primary care provider for further evaluation and management of your symptoms as well as ongoing wellness  visits.  I hope you feel better!

## 2023-05-04 NOTE — ED Triage Notes (Signed)
Pt bib daughter. Pt c/o body aches and pain X 4  days.  Pt took a muscle relaxer that made her feel fatigued and reports vomiting.

## 2023-05-04 NOTE — ED Provider Notes (Addendum)
[facility]    CSN: [id number] Arrival date & time: 05/04/23  0808      History   Chief Complaint Chief Complaint  Patient presents with   Generalized Body Aches   Emesis    HPI Tammy Ali is a 52 y.o. female.   Patient presents to urgent care for evaluation of headache, nausea, vomiting, decreased appetite, and body aches that started 3 days ago after she took muscle relaxer from home medication supply on Saturday May 01, 2023.  She also reports cough and sore throat for the last 2-3 days. Headache is to the back of the head and described as "pounding". Sore throat has improved and describes the cough as dry. No fever at home. Reports current nausea. Last episode of emesis was yesterday and non-blood/ynon bilious. 2 days ago on Sunday May 02, 2023 was "the worst day" and she has not been able to keep down water or food since 3 days ago on Saturday May 01, 2023. No known sick contacts. She is a diabetic and has not been taking her diabetic medications for the last 2-3 days due to illness and has not been checking her blood sugar at home. She reports feeling shaky and dizzy intermittently. No blurred vision, vision changes, chest pain, shortness of breath,diarrhea, or recent antibiotic/steroid use. States the left lower abdomen is hurting without other areas of abdominal pain.  Reports urinary frequency without dysuria, urgency, or hesitancy.  Denies flank pain and history of pyelonephritis.  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 She took 1 pill of the valtrex but stopped taking this due to the nausea/vomiting.  She has not been using any over-the-counter medications to help with her symptoms as she has not been able to keep anything down due to nausea and vomiting.  The history is provided by the patient and a relative. The history is limited by a language barrier. A language interpreter  was used Nurse, children's interpreter, patient declined.  She would like for her daughter to translate for her throughout the entire medical encounter.).  Emesis   Past Medical History:  Diagnosis Date   Anemia    GDM (gestational diabetes mellitus)    Hepatitis B carrier [facility])     Patient Active Problem List   Diagnosis Date Noted   Dyspareunia in female 08/27/2022   Abnormal laboratory test result 08/27/2022   Amenorrhea 08/27/2022   Blood in urine 08/27/2022   Constipation 08/27/2022   Gestational diabetes mellitus 08/27/2022   Multigravida of advanced maternal age 35/14/2023   Pain in pelvis 08/27/2022   Pain of breast 08/27/2022   Pyuria 08/27/2022   Diabetes mellitus, type 2 (HCC) 01/20/2022   Active labor at term 06/13/2016   Chronic type B viral hepatitis (HCC) 03/23/2016    Past Surgical History:  Procedure Laterality Date   BRAIN SURGERY     ?crainiotomy ?shunt placement- for headaches    OB History     Gravida  7   Para  6   Term  5   Preterm  1   AB  1   Living  1      SAB  1   IAB  0   Ectopic  0   Multiple      Live Births  1            Home Medications    Prior to Admission  medications   Medication Sig Start Date End Date Taking? Authorizing Provider  cephALEXin (KEFLEX) 500 MG capsule Take 1 capsule (500 mg total) by mouth 2 (two) times daily for 7 days. 05/04/23 05/11/23 Yes Carlisle Beers, FNP  ondansetron (ZOFRAN-ODT) 4 MG disintegrating tablet Take 1 tablet (4 mg total) by mouth every 8 (eight) hours as needed for nausea or vomiting. 05/04/23  Yes Carlisle Beers, FNP  acetaminophen (TYLENOL) 500 MG tablet Take 500 mg by mouth every 6 (six) hours as needed.    [provider]  benzonatate (TESSALON) 100 MG capsule Take 1 capsule (100 mg total) by mouth every 8 (eight) hours as needed for cough. 05/01/23   Gustavus Bryant, FNP  fluticasone (FLONASE) 50 MCG/ACT nasal spray Place 1 spray into both nostrils  daily. 05/01/23   Gustavus Bryant, FNP  gabapentin (NEURONTIN) 100 MG capsule Take 1 capsule at bedtime for one week, then 1 capsule twice daily for one week, then 2 capsules twice daily 04/13/22   Drema Dallas, DO  meclizine (ANTIVERT) 12.5 MG tablet Take 1 tablet (12.5 mg total) by mouth 3 (three) times daily as needed for dizziness. Patient not taking: Reported on 04/13/2022 01/19/22   Rema Fendt, NP  metFORMIN (GLUCOPHAGE) 500 MG tablet Take 1 tablet (500 mg total) by mouth 2 (two) times daily with a meal. 02/23/23 05/24/23  Rema Fendt, NP  Multiple Vitamins-Minerals (MULTIVITAMIN) tablet Take 1 tablet by mouth daily. 08/27/22   Rema Fendt, NP  valACYclovir (VALTREX) 1000 MG tablet Take 1 tablet (1,000 mg total) by mouth 2 (two) times daily for 7 days. 05/01/23 05/08/23  Gustavus Bryant, FNP    Family History Family History  Adopted: Yes    Social History Social History   Tobacco Use   Smoking status: Never    Passive exposure: Never   Smokeless tobacco: Never  Vaping Use   Vaping Use: Never used  Substance Use Topics   Alcohol use: No   Drug use: No     Allergies   Patient has no known allergies.   Review of Systems Review of Systems  Gastrointestinal:  Positive for vomiting.  Per HPI   Physical Exam Triage Vital Signs ED Triage Vitals  Enc Vitals Group     BP 05/04/23 0827 128/86     Pulse Rate 05/04/23 0827 64     Resp 05/04/23 0827 17     Temp 05/04/23 0827 98 F (36.7 C)     Temp Source 05/04/23 0827 Oral     SpO2 05/04/23 0827 97 %     Weight --      Height --      Head Circumference --      Peak Flow --      Pain Score 05/04/23 0826 10     Pain Loc --      Pain Edu? --      Excl. in GC? --    No data found.  Updated Vital Signs BP 128/86 (BP Location: Left Arm)   Pulse 64   Temp 98 F (36.7 C) (Oral)   Resp 17   SpO2 97%   Visual Acuity Right Eye Distance:   Left Eye Distance:   Bilateral Distance:    Right Eye Near:   Left Eye  Near:    Bilateral Near:     Physical Exam Vitals and nursing note reviewed.  Constitutional:      Appearance: She is ill-appearing.  She is not toxic-appearing.  HENT:     Head: Normocephalic and atraumatic.     Right Ear: Hearing, tympanic membrane, ear canal and external ear normal.     Left Ear: Hearing, tympanic membrane, ear canal and external ear normal.     Nose: Congestion present.     Mouth/Throat:     Lips: Pink.     Mouth: Mucous membranes are moist. No injury.     Tongue: No lesions. Tongue does not deviate from midline.     Palate: No mass and lesions.     Pharynx: Oropharynx is clear. Uvula midline. No pharyngeal swelling, oropharyngeal exudate, posterior oropharyngeal erythema or uvula swelling.     Tonsils: No tonsillar exudate or tonsillar abscesses.  Eyes:     General: Lids are normal. Vision grossly intact. Gaze aligned appropriately.     Extraocular Movements: Extraocular movements intact.     Conjunctiva/sclera: Conjunctivae normal.  Cardiovascular:     Rate and Rhythm: Normal rate and regular rhythm.     Heart sounds: Normal heart sounds, S1 normal and S2 normal.  Pulmonary:     Effort: Pulmonary effort is normal. No respiratory distress.     Breath sounds: Normal breath sounds and air entry. No stridor. No wheezing, rhonchi or rales.  Chest:     Chest wall: No tenderness.  Abdominal:     General: Abdomen is flat. Bowel sounds are normal.     Palpations: Abdomen is soft.     Tenderness: There is no abdominal tenderness. There is no right CVA tenderness, left CVA tenderness or guarding.     Comments: No peritoneal signs to abdominal exam.  She is nontender to the abdomen.  Musculoskeletal:     Cervical back: Neck supple.  Lymphadenopathy:     Cervical: No cervical adenopathy.  Skin:    General: Skin is warm and dry.     Capillary Refill: Capillary refill takes less than 2 seconds.     Findings: No rash.  Neurological:     General: No focal deficit  present.     Mental Status: She is alert and oriented to person, place, and time. Mental status is at baseline.     Cranial Nerves: No dysarthria or facial asymmetry.  Psychiatric:        Mood and Affect: Mood normal.        Speech: Speech normal.        Behavior: Behavior normal.        Thought Content: Thought content normal.        Judgment: Judgment normal.      UC Treatments / Results  Labs (all labs ordered are listed, but only abnormal results are displayed) Labs Reviewed  POCT URINALYSIS DIP (MANUAL ENTRY) - Abnormal; Notable for the following components:      Result Value   Clarity, UA cloudy (*)    Blood, UA trace-lysed (*)    Leukocytes, UA Large (3+) (*)    All other components within normal limits  POCT FASTING CBG [facility] MANUAL ENTRY - Abnormal; Notable for the following components:   POCT Glucose ([facility]) 130 (*)    All other components within normal limits  SARS CORONAVIRUS 2 (TAT 6-24 HRS)  URINE CULTURE  POCT URINE PREGNANCY    EKG   Radiology No results found.  Procedures Procedures (including critical care time)  Medications Ordered in UC Medications  ketorolac (TORADOL) 30 MG/ML injection 30 mg (30 mg Intramuscular Given 05/04/23 0935)  ondansetron (ZOFRAN-ODT)  disintegrating tablet 4 mg (4 mg Oral Given 05/04/23 0936)    Initial Impression / Assessment and Plan / UC Course  I have reviewed the triage vital signs and the nursing notes.  Pertinent labs & imaging results that were available during my care of the patient were reviewed by me and considered in my medical decision making (see chart for details).   1.  Viral URI with cough, bad headache Symptoms and physical exam consistent with a viral upper respiratory tract infection that will likely resolve with rest, fluids, and prescriptions for symptomatic relief. Deferred imaging based on stable cardiopulmonary exam and hemodynamically stable vital signs.  COVID-19 testing is pending, will call  patient when this comes back if this is positive.  She is a candidate for antiviral treatment and may have Paxlovid if she tests positive for COVID-19. Reviewed recent blood work from 11/2022 showing stable kidney function. Patient given ketorolac 30 mg IM for headache, pain reduced from 10 on a scale of 0-10 to Tammy 8 on a scale of 0-10 prior to discharge shortly after administering ketorolac medication.  Advised to avoid NSAIDs for the next 24 hours but may start taking Motrin as needed tomorrow for headache/body aches.  May use Tylenol as needed for symptoms as well.  Neurologically intact to baseline currently.  She may continue taking medications sent in from previous visit on May 01, 2023 [facility] [place]) as well as Zyrtec/guaifenesin/other OTC medications as needed for symptomatic relief.  Advised to push fluids to stay well-hydrated.   2.  Acute cystitis with hematuria, negative urine pregnancy test, type 2 diabetes with other specified complication Urinalysis shows signs consistent with probable urinary tract infection (leukocytes, red blood cells, and cloudy urine).  No glycosuria.  CBG is 130.  Nausea and vomiting is likely related to urinary tract infection.  Zofran 4 mg given in clinic for nausea and vomiting with significant improvement prior to discharge.  She is not currently experiencing any systemic symptoms or signs of pyelonephritis.  Nontender to the bilateral CVAs and abdominal exam is without peritoneal signs, therefore deferred referral to the emergency department for advanced imaging/further care.  Encouraged to sip on clear liquids until able to tolerate this without vomiting, then may increase to bland foods and normal diet as tolerated.   Discussed physical exam and available lab work findings in clinic with patient.  Counseled patient regarding appropriate use of medications and potential side effects for all medications recommended or prescribed today. Discussed red flag  signs and symptoms of worsening condition,when to call the PCP office, return to urgent care, and when to seek higher level of care in the emergency department. Patient verbalizes understanding and agreement with plan. All questions answered. Patient discharged in stable condition.     Final Clinical Impressions(s) / UC Diagnoses   Final diagnoses:  Viral URI with cough  Bad headache  Acute cystitis with hematuria  Type 2 diabetes mellitus with other specified complication, without long-term current use of insulin (HCC)  Urine pregnancy test negative     Discharge Instructions      Your symptoms are likely due to a viral upper respiratory tract infection as well as a urinary tract infection.  Urine pregnancy test is negative.  I have tested you for COVID today and this test will come back in the next 12 to 24 hours.  I will call you if your test is positive and send Tammy antiviral treatment at that time if necessary.  Continue using medications sent in by previous provider 3 days ago (Flonase and [employer]) as needed for cough and congestion.  You may also purchase Zyrtec and use this over-the-counter to help dry up your nasal drainage.  Your urine shows that you have a urinary tract infection which is what I believe is contributing to your nausea and vomiting/abdominal discomfort.  Please start taking Keflex antibiotic twice daily for the next 7 days to treat this urinary tract infection.  I gave you a medication called Zofran in the clinic to help with your nausea and vomiting. You may take this every 8 hours as needed for nausea and vomiting.  I gave you a shot of ketorolac (anti-inflammatory medication) for your headache. You may take Tylenol at home as needed for your headache. Do not take any ibuprofen for the next 24 hours since the ketorolac shot I gave you is similar to ibuprofen.  Slowly start sipping on fluids until you are able to keep fluids down without  vomiting, then increase your diet to bananas, toast, rice, applesauce, and other bland foods. After you are able to keep down bland foods without vomiting, you may increase your diet back to normal as tolerated.  Please schedule Tammy appointment with your primary care provider in the next 1 to 2 weeks for recheck.  If you develop any new or worsening symptoms or do not improve in the next 2 to 3 days, please return.  If your symptoms are severe, please go to the emergency room.  Follow-up with your primary care provider for further evaluation and management of your symptoms as well as ongoing wellness visits.  I hope you feel better!     ED Prescriptions     Medication Sig Dispense Auth. Provider   cephALEXin (KEFLEX) 500 MG capsule Take 1 capsule (500 mg total) by mouth 2 (two) times daily for 7 days. 14 capsule Reita May M, FNP   ondansetron (ZOFRAN-ODT) 4 MG disintegrating tablet Take 1 tablet (4 mg total) by mouth every 8 (eight) hours as needed for nausea or vomiting. 20 tablet Carlisle Beers, FNP      PDMP not reviewed this encounter.   Carlisle Beers, FNP 05/04/23 1000    Carlisle Beers, FNP 05/04/23 1001

## 2023-05-05 LAB — SARS CORONAVIRUS 2 (TAT 6-24 HRS): SARS Coronavirus 2: NEGATIVE

## 2023-05-06 ENCOUNTER — Telehealth: Payer: Self-pay | Admitting: Family

## 2023-05-06 NOTE — Telephone Encounter (Signed)
Please result labs

## 2023-05-06 NOTE — Telephone Encounter (Signed)
Pt's daughter is calling in to check on the status of pt's lab results. Pt's daughter is requesting Amy call her back with information on what they can do in the meantime while they wait on results. Pt's daughter says pt isn't having any symptoms just feeling fatigued. Please advise.

## 2023-05-11 NOTE — Telephone Encounter (Signed)
Call patient with update.   05/01/2023 labs from [facility] at [facility]) visit: - Rapid Strep negative. - Culture group A Strep negative.  - HSV culture and typing negative.   05/04/2023 [facility]) visit: - Poct CBG unremarkable.  - SARS Coronavirus 2 negative.  - Poct urinalysis no urinary tract infection. - Poct urine pregnancy negative.  Please schedule appointment with Primary Care for further evaluation/management. During the interim report to the Emergency Department for immediate evaluation/management.

## 2023-08-26 ENCOUNTER — Ambulatory Visit: Payer: Commercial Managed Care - HMO | Admitting: Family

## 2023-09-07 ENCOUNTER — Ambulatory Visit (INDEPENDENT_AMBULATORY_CARE_PROVIDER_SITE_OTHER): Payer: PRIVATE HEALTH INSURANCE | Admitting: Family

## 2023-09-07 ENCOUNTER — Encounter: Payer: Self-pay | Admitting: Family

## 2023-09-07 ENCOUNTER — Other Ambulatory Visit: Payer: Self-pay | Admitting: Family

## 2023-09-07 VITALS — BP 114/78 | HR 66 | Temp 98.6°F | Ht 63.5 in | Wt 187.8 lb

## 2023-09-07 DIAGNOSIS — Z758 Other problems related to medical facilities and other health care: Secondary | ICD-10-CM

## 2023-09-07 DIAGNOSIS — Z13220 Encounter for screening for lipoid disorders: Secondary | ICD-10-CM | POA: Diagnosis not present

## 2023-09-07 DIAGNOSIS — K5900 Constipation, unspecified: Secondary | ICD-10-CM

## 2023-09-07 DIAGNOSIS — Z0100 Encounter for examination of eyes and vision without abnormal findings: Secondary | ICD-10-CM | POA: Diagnosis not present

## 2023-09-07 DIAGNOSIS — E1165 Type 2 diabetes mellitus with hyperglycemia: Secondary | ICD-10-CM

## 2023-09-07 DIAGNOSIS — Z7984 Long term (current) use of oral hypoglycemic drugs: Secondary | ICD-10-CM

## 2023-09-07 DIAGNOSIS — E119 Type 2 diabetes mellitus without complications: Secondary | ICD-10-CM

## 2023-09-07 DIAGNOSIS — Z603 Acculturation difficulty: Secondary | ICD-10-CM

## 2023-09-07 LAB — POCT GLYCOSYLATED HEMOGLOBIN (HGB A1C): HbA1c, POC (controlled diabetic range): 6.5 % (ref 0.0–7.0)

## 2023-09-07 MED ORDER — METFORMIN HCL 500 MG PO TABS
500.0000 mg | ORAL_TABLET | Freq: Two times a day (BID) | ORAL | 0 refills | Status: DC
Start: 2023-09-07 — End: 2023-10-05

## 2023-09-07 MED ORDER — POLYETHYLENE GLYCOL 3350 17 GM/SCOOP PO POWD
17.0000 g | Freq: Every day | ORAL | 0 refills | Status: AC | PRN
Start: 2023-09-07 — End: ?

## 2023-09-07 NOTE — Progress Notes (Signed)
Patient ID: Skilynn Moist, female    DOB: 1971/12/10  MRN: [id number]  CC: Chronic Conditions Management  Subjective: Tammy Ali is a 52 y.o. female who presents for chronic conditions management. She is accompanied by her husband and daughter.  Her concerns today include:  - She is not taking Metformin per her preference. She denies red flag symptoms associated with diabetes.  - Constipation. Reports one bowel movement daily. She denies red flag symptoms. - Bilateral external ears dry and itching. She denies additional symptoms. I discussed with patient conservative measures and she verbalized understanding/agreement. She is aware to follow-up as scheduled.   Patient Active Problem List   Diagnosis Date Noted   Dyspareunia in female 08/27/2022   Abnormal laboratory test result 08/27/2022   Amenorrhea 08/27/2022   Blood in urine 08/27/2022   Constipation 08/27/2022   Gestational diabetes mellitus 08/27/2022   Multigravida of advanced maternal age 35/14/2023   Pain in pelvis 08/27/2022   Pain of breast 08/27/2022   Pyuria 08/27/2022   Diabetes mellitus, type 2 (HCC) 01/20/2022   Active labor at term 06/13/2016   Chronic type B viral hepatitis (HCC) 03/23/2016     Current Outpatient Medications on File Prior to Visit  Medication Sig Dispense Refill   acetaminophen (TYLENOL) 500 MG tablet Take 500 mg by mouth every 6 (six) hours as needed.     metFORMIN (GLUCOPHAGE) 500 MG tablet Take 1 tablet (500 mg total) by mouth 2 (two) times daily with a meal. 60 tablet 2   No current facility-administered medications on file prior to visit.    No Known Allergies  Social History   Socioeconomic History   Marital status: Legally Separated    Spouse name: Not on file   Number of children: Not on file   Years of education: Not on file   Highest education level: Not on file  Occupational History   Not on file  Tobacco Use   Smoking status: Never    Passive exposure: Never    Smokeless tobacco: Never  Vaping Use   Vaping status: Never Used  Substance and Sexual Activity   Alcohol use: No   Drug use: No   Sexual activity: Yes    Birth control/protection: None  Other Topics Concern   Not on file  Social History Narrative   ** Merged History Encounter **       Social Determinants of Health   Financial Resource Strain: Not on file  Food Insecurity: Not on file  Transportation Needs: Not on file  Physical Activity: Not on file  Stress: Not on file  Social Connections: Not on file  Intimate Partner Violence: Not on file    Family History  Adopted: Yes    Past Surgical History:  Procedure Laterality Date   BRAIN SURGERY     ?crainiotomy ?shunt placement- for headaches    ROS: Review of Systems Negative except as stated above  PHYSICAL EXAM: BP 114/78   Pulse 66   Temp 98.6 F (37 C) (Oral)   Ht 5' 3.5" (1.613 m)   Wt 187 lb 12.8 oz (85.2 kg)   SpO2 96%   BMI 32.75 kg/m   Physical Exam HENT:     Head: Normocephalic and atraumatic.     Right Ear: Tympanic membrane, ear canal and external ear normal.     Left Ear: Tympanic membrane, ear canal and external ear normal.     Nose: Nose normal.     Mouth/Throat:  Mouth: Mucous membranes are moist.     Pharynx: Oropharynx is clear.  Eyes:     Extraocular Movements: Extraocular movements intact.     Conjunctiva/sclera: Conjunctivae normal.     Pupils: Pupils are equal, round, and reactive to light.  Cardiovascular:     Rate and Rhythm: Normal rate and regular rhythm.     Pulses: Normal pulses.     Heart sounds: Normal heart sounds.  Pulmonary:     Effort: Pulmonary effort is normal.     Breath sounds: Normal breath sounds.  Abdominal:     General: Bowel sounds are normal.     Palpations: Abdomen is soft.  Musculoskeletal:        General: Normal range of motion.     Cervical back: Normal range of motion and neck supple.  Neurological:     General: No focal deficit present.      Mental Status: She is alert and oriented to person, place, and time.  Psychiatric:        Mood and Affect: Mood normal.        Behavior: Behavior normal.     ASSESSMENT AND PLAN: 1. Type 2 diabetes mellitus with hyperglycemia, without long-term current use of insulin (HCC) - Patient non-adherent to Metformin regimen per her preference.  - Routine screening.  - Discussed the importance of healthy eating habits, low-carbohydrate diet, low-sugar diet, regular aerobic exercise (at least 150 minutes a week as tolerated) and medication compliance to achieve or maintain control of diabetes. - Follow-up with primary provider as scheduled.  - POCT glycosylated hemoglobin (Hb A1C); Future - Basic Metabolic Panel - Microalbumin / creatinine urine ratio  2. Diabetic eye exam [facility]) - Referral to Ophthalmology for evaluation/management. - Ambulatory referral to Ophthalmology  3. Encounter for diabetic foot exam [facility]) - Referral to Podiatry for evaluation/management.  - Ambulatory referral to Podiatry  4. Screening cholesterol level - Routine screening.  - Lipid panel  5. Constipation, unspecified constipation type - Polyethylene Glycol Powder as prescribed. Counseled on medication adherence. - Follow-up with primary provider in 4 weeks or sooner if needed.  - polyethylene glycol powder (GLYCOLAX/MIRALAX) 17 GM/SCOOP powder; Take 17 g by mouth daily as needed.  Dispense: 3350 g; Refill: 0  6. Language barrier - [place] in-person interpreter, Y Hin    Patient was given the opportunity to ask questions.  Patient verbalized understanding of the plan and was able to repeat key elements of the plan. Patient was given clear instructions to go to Emergency Department or return to medical center if symptoms don't improve, worsen, or new problems develop.The patient verbalized understanding.   Orders Placed This Encounter  Procedures   Basic Metabolic Panel   Lipid panel   Microalbumin /  creatinine urine ratio   Ambulatory referral to Ophthalmology   Ambulatory referral to Podiatry   POCT glycosylated hemoglobin (Hb A1C)     Requested Prescriptions   Signed Prescriptions Disp Refills   polyethylene glycol powder (GLYCOLAX/MIRALAX) 17 GM/SCOOP powder 3350 g 0    Sig: Take 17 g by mouth daily as needed.    Return in about 4 weeks (around 10/05/2023) for Follow-Up or next available.  Rema Fendt, NP

## 2023-09-07 NOTE — Progress Notes (Signed)
Patient states constipation, and only uses restroom once a week.  States her ears have been irritating her.

## 2023-09-08 LAB — BASIC METABOLIC PANEL
BUN/Creatinine Ratio: 16 (ref 9–23)
BUN: 12 mg/dL (ref 6–24)
CO2: 24 mmol/L (ref 20–29)
Calcium: 9 mg/dL (ref 8.7–10.2)
Chloride: 105 mmol/L (ref 96–106)
Creatinine, Ser: 0.77 mg/dL (ref 0.57–1.00)
Glucose: 97 mg/dL (ref 70–99)
Potassium: 4.6 mmol/L (ref 3.5–5.2)
Sodium: 142 mmol/L (ref 134–144)
eGFR: 93 mL/min/{1.73_m2} (ref >59)

## 2023-09-08 LAB — MICROALBUMIN / CREATININE URINE RATIO
Creatinine, Urine: 97.3 mg/dL
Microalb/Creat Ratio: 4 mg/g creat (ref 0–29)
Microalbumin, Urine: 3.9 ug/mL

## 2023-09-09 LAB — LIPID PANEL
Chol/HDL Ratio: 4.4 ratio (ref 0.0–4.4)
Cholesterol, Total: 190 mg/dL (ref 100–199)
HDL: 43 mg/dL (ref >39)
LDL Chol Calc (NIH): 127 mg/dL — ABNORMAL HIGH (ref 0–99)
Triglycerides: 113 mg/dL (ref 0–149)
VLDL Cholesterol Cal: 20 mg/dL (ref 5–40)

## 2023-09-09 LAB — SPECIMEN STATUS REPORT

## 2023-09-10 ENCOUNTER — Other Ambulatory Visit: Payer: Self-pay | Admitting: Family

## 2023-09-10 DIAGNOSIS — Z13220 Encounter for screening for lipoid disorders: Secondary | ICD-10-CM

## 2023-10-05 ENCOUNTER — Other Ambulatory Visit (HOSPITAL_COMMUNITY)
Admission: RE | Admit: 2023-10-05 | Discharge: 2023-10-05 | Disposition: A | Discharge status: Home / Self Care | Payer: No Typology Code available for payment source | Source: Ambulatory Visit | Attending: Family | Admitting: Family

## 2023-10-05 ENCOUNTER — Ambulatory Visit (INDEPENDENT_AMBULATORY_CARE_PROVIDER_SITE_OTHER): Payer: No Typology Code available for payment source | Admitting: Family

## 2023-10-05 ENCOUNTER — Encounter: Payer: Self-pay | Admitting: Family

## 2023-10-05 VITALS — BP 107/73 | HR 62 | Temp 98.2°F | Ht 63.0 in | Wt 189.4 lb

## 2023-10-05 DIAGNOSIS — R109 Unspecified abdominal pain: Secondary | ICD-10-CM

## 2023-10-05 DIAGNOSIS — Z603 Acculturation difficulty: Secondary | ICD-10-CM | POA: Diagnosis not present

## 2023-10-05 DIAGNOSIS — E119 Type 2 diabetes mellitus without complications: Secondary | ICD-10-CM

## 2023-10-05 DIAGNOSIS — Z13220 Encounter for screening for lipoid disorders: Secondary | ICD-10-CM | POA: Diagnosis not present

## 2023-10-05 DIAGNOSIS — Z7984 Long term (current) use of oral hypoglycemic drugs: Secondary | ICD-10-CM

## 2023-10-05 DIAGNOSIS — Z758 Other problems related to medical facilities and other health care: Secondary | ICD-10-CM

## 2023-10-05 LAB — POCT URINALYSIS DIP (CLINITEK)
Bilirubin, UA: NEGATIVE
Blood, UA: NEGATIVE
Glucose, UA: NEGATIVE mg/dL
Ketones, POC UA: NEGATIVE mg/dL
Nitrite, UA: NEGATIVE
POC PROTEIN,UA: NEGATIVE
Spec Grav, UA: >=1.03 — AB (ref 1.010–1.025)
Urobilinogen, UA: 0.2 U/dL
pH, UA: 6 (ref 5.0–8.0)

## 2023-10-05 MED ORDER — METFORMIN HCL 500 MG PO TABS
500.0000 mg | ORAL_TABLET | Freq: Two times a day (BID) | ORAL | 0 refills | Status: DC
Start: 2023-10-05 — End: 2023-11-02

## 2023-10-05 NOTE — Progress Notes (Signed)
Patient states after a meal pain in right abdomen under right breast.   States neck pain that radiates to left side of her head.   Declined Flu vaccine.

## 2023-10-05 NOTE — Addendum Note (Signed)
Addended by: Kieth Brightly on: 10/05/2023 04:50 PM   Modules accepted: Orders

## 2023-10-05 NOTE — Progress Notes (Signed)
Patient ID: Tammy Ali, female    DOB: Jul 09, 1971  MRN: [id number]  CC: Chronic Conditions Follow-Up  Subjective: Nayla Musacchia is a 52 y.o. female who presents for chronic conditions follow-up.   Her concerns today include:  - Reports since previous office visit she did not begin Metformin due to she did not pickup from pharmacy. Requests prescription to be resent to pharmacy. She denies red flag symptoms associated with diabetes.  - Intermittent right lower stomach pain. Reports happens after eating. She denies red flag symptoms.   Patient Active Problem List   Diagnosis Date Noted   Dyspareunia in female 08/27/2022   Abnormal laboratory test result 08/27/2022   Amenorrhea 08/27/2022   Blood in urine 08/27/2022   Constipation 08/27/2022   Gestational diabetes mellitus 08/27/2022   Multigravida of advanced maternal age 65/14/2023   Pain in pelvis 08/27/2022   Pain of breast 08/27/2022   Pyuria 08/27/2022   Diabetes mellitus, type 2 (HCC) 01/20/2022   Active labor at term 06/13/2016   Chronic type B viral hepatitis (HCC) 03/23/2016     Current Outpatient Medications on File Prior to Visit  Medication Sig Dispense Refill   acetaminophen (TYLENOL) 500 MG tablet Take 500 mg by mouth every 6 (six) hours as needed.     polyethylene glycol powder (GLYCOLAX/MIRALAX) 17 GM/SCOOP powder Take 17 g by mouth daily as needed. (Patient not taking: Reported on 10/05/2023) 3350 g 0   No current facility-administered medications on file prior to visit.    No Known Allergies  Social History   Socioeconomic History   Marital status: Legally Separated    Spouse name: Not on file   Number of children: Not on file   Years of education: Not on file   Highest education level: Not on file  Occupational History   Not on file  Tobacco Use   Smoking status: Never    Passive exposure: Never   Smokeless tobacco: Never  Vaping Use   Vaping status: Never Used  Substance and Sexual Activity    Alcohol use: No   Drug use: No   Sexual activity: Yes    Birth control/protection: None  Other Topics Concern   Not on file  Social History Narrative   ** Merged History Encounter **       Social Determinants of Health   Financial Resource Strain: Not on file  Food Insecurity: Not on file  Transportation Needs: Not on file  Physical Activity: Not on file  Stress: Not on file  Social Connections: Not on file  Intimate Partner Violence: Not on file    Family History  Adopted: Yes    Past Surgical History:  Procedure Laterality Date   BRAIN SURGERY     ?crainiotomy ?shunt placement- for headaches    ROS: Review of Systems Negative except as stated above  PHYSICAL EXAM: BP 107/73   Pulse 62   Temp 98.2 F (36.8 C) (Oral)   Ht 5\' 3"  (1.6 m)   Wt 189 lb 6.4 oz (85.9 kg)   SpO2 95%   BMI 33.55 kg/m   Physical Exam HENT:     Head: Normocephalic and atraumatic.     Nose: Nose normal.     Mouth/Throat:     Mouth: Mucous membranes are moist.     Pharynx: Oropharynx is clear.  Eyes:     Extraocular Movements: Extraocular movements intact.     Conjunctiva/sclera: Conjunctivae normal.     Pupils: Pupils are equal, round,  and reactive to light.  Cardiovascular:     Rate and Rhythm: Normal rate and regular rhythm.     Pulses: Normal pulses.     Heart sounds: Normal heart sounds.  Pulmonary:     Effort: Pulmonary effort is normal.     Breath sounds: Normal breath sounds.  Abdominal:     General: Bowel sounds are normal.     Palpations: Abdomen is soft.  Musculoskeletal:        General: Normal range of motion.     Cervical back: Normal range of motion and neck supple.  Neurological:     General: No focal deficit present.     Mental Status: She is alert and oriented to person, place, and time.  Psychiatric:        Mood and Affect: Mood normal.        Behavior: Behavior normal.     ASSESSMENT AND PLAN: 1. Type 2 diabetes mellitus without complication,  without long-term current use of insulin (HCC) - Hemoglobin A1c 6.5% on 09/07/2023. - Metformin as prescribed. Counseled on medication adherence/adverse effects.  - Discussed the importance of healthy eating habits, low-carbohydrate diet, low-sugar diet, regular aerobic exercise (at least 150 minutes a week as tolerated) and medication compliance to achieve or maintain control of diabetes. - Follow-up with primary provider in 4 weeks or sooner if needed.  - metFORMIN (GLUCOPHAGE) 500 MG tablet; Take 1 tablet (500 mg total) by mouth 2 (two) times daily with a meal.  Dispense: 180 tablet; Refill: 0  2. Abdominal pain, unspecified abdominal location - Routine screening.  - CMP14+EGFR - CBC - Amylase - Lipase - POCT URINALYSIS DIP (CLINITEK); Future - Cervicovaginal ancillary only  3. Language barrier - [place] in-person interpreter, Luberta Robertson.   Patient was given the opportunity to ask questions.  Patient verbalized understanding of the plan and was able to repeat key elements of the plan. Patient was given clear instructions to go to Emergency Department or return to medical center if symptoms don't improve, worsen, or new problems develop.The patient verbalized understanding.   Orders Placed This Encounter  Procedures   CMP14+EGFR   CBC   Amylase   Lipase   POCT URINALYSIS DIP (CLINITEK)     Requested Prescriptions   Signed Prescriptions Disp Refills   metFORMIN (GLUCOPHAGE) 500 MG tablet 180 tablet 0    Sig: Take 1 tablet (500 mg total) by mouth 2 (two) times daily with a meal.    Return in about 4 weeks (around 11/02/2023) for Follow-Up or next available chronic conditions.  Rema Fendt, NP

## 2023-10-06 ENCOUNTER — Other Ambulatory Visit: Payer: Self-pay | Admitting: Family

## 2023-10-06 DIAGNOSIS — E785 Hyperlipidemia, unspecified: Secondary | ICD-10-CM | POA: Insufficient documentation

## 2023-10-06 LAB — LIPID PANEL
Chol/HDL Ratio: 4.9 ratio — ABNORMAL HIGH (ref 0.0–4.4)
Cholesterol, Total: 190 mg/dL (ref 100–199)
HDL: 39 mg/dL — ABNORMAL LOW (ref >39)
LDL Chol Calc (NIH): 115 mg/dL — ABNORMAL HIGH (ref 0–99)
Triglycerides: 208 mg/dL — ABNORMAL HIGH (ref 0–149)
VLDL Cholesterol Cal: 36 mg/dL (ref 5–40)

## 2023-10-06 MED ORDER — ATORVASTATIN CALCIUM 20 MG PO TABS
20.0000 mg | ORAL_TABLET | Freq: Every day | ORAL | 0 refills | Status: DC
Start: 2023-10-06 — End: 2023-11-30

## 2023-10-07 LAB — CERVICOVAGINAL ANCILLARY ONLY
Bacterial Vaginitis (gardnerella): NEGATIVE
Candida Glabrata: NEGATIVE
Candida Vaginitis: NEGATIVE
Chlamydia: NEGATIVE
Comment: NEGATIVE
Comment: NEGATIVE
Comment: NEGATIVE
Comment: NEGATIVE
Comment: NEGATIVE
Comment: NORMAL
Neisseria Gonorrhea: NEGATIVE
Trichomonas: NEGATIVE

## 2023-10-07 LAB — AMYLASE: Amylase: 30 U/L — ABNORMAL LOW (ref 31–110)

## 2023-10-07 LAB — CMP14+EGFR
ALT: 22 [IU]/L (ref 0–32)
AST: 22 [IU]/L (ref 0–40)
Albumin: 4.5 g/dL (ref 3.8–4.9)
Alkaline Phosphatase: 97 [IU]/L (ref 44–121)
BUN/Creatinine Ratio: 23 (ref 9–23)
BUN: 17 mg/dL (ref 6–24)
Bilirubin Total: <0.2 mg/dL (ref 0.0–1.2)
CO2: 20 mmol/L (ref 20–29)
Calcium: 9 mg/dL (ref 8.7–10.2)
Chloride: 103 mmol/L (ref 96–106)
Creatinine, Ser: 0.75 mg/dL (ref 0.57–1.00)
Globulin, Total: 2.7 g/dL (ref 1.5–4.5)
Glucose: 98 mg/dL (ref 70–99)
Potassium: 4.3 mmol/L (ref 3.5–5.2)
Sodium: 140 mmol/L (ref 134–144)
Total Protein: 7.2 g/dL (ref 6.0–8.5)
eGFR: 96 mL/min/{1.73_m2} (ref >59)

## 2023-10-07 LAB — LIPASE: Lipase: 43 U/L (ref 14–72)

## 2023-10-07 LAB — SPECIMEN STATUS REPORT

## 2023-10-11 ENCOUNTER — Encounter: Payer: Self-pay | Admitting: *Deleted

## 2023-10-12 ENCOUNTER — Other Ambulatory Visit: Payer: Self-pay | Admitting: Family

## 2023-10-12 DIAGNOSIS — R109 Unspecified abdominal pain: Secondary | ICD-10-CM

## 2023-11-02 ENCOUNTER — Encounter: Payer: Self-pay | Admitting: Family

## 2023-11-02 ENCOUNTER — Other Ambulatory Visit: Payer: No Typology Code available for payment source | Admitting: Family

## 2023-11-02 ENCOUNTER — Ambulatory Visit (INDEPENDENT_AMBULATORY_CARE_PROVIDER_SITE_OTHER): Payer: No Typology Code available for payment source | Admitting: Family

## 2023-11-02 VITALS — BP 112/78 | HR 61 | Temp 98.2°F | Ht 63.0 in | Wt 191.0 lb

## 2023-11-02 DIAGNOSIS — Z7984 Long term (current) use of oral hypoglycemic drugs: Secondary | ICD-10-CM

## 2023-11-02 DIAGNOSIS — Z603 Acculturation difficulty: Secondary | ICD-10-CM

## 2023-11-02 DIAGNOSIS — E1165 Type 2 diabetes mellitus with hyperglycemia: Secondary | ICD-10-CM

## 2023-11-02 DIAGNOSIS — Z758 Other problems related to medical facilities and other health care: Secondary | ICD-10-CM

## 2023-11-02 MED ORDER — EMPAGLIFLOZIN 10 MG PO TABS
10.0000 mg | ORAL_TABLET | Freq: Every day | ORAL | 1 refills | Status: DC
Start: 2023-11-02 — End: 2023-12-27

## 2023-11-02 NOTE — Progress Notes (Signed)
Patient states metformin gives her bad side effects.

## 2023-11-02 NOTE — Progress Notes (Signed)
Patient ID: Tammy Ali, female    DOB: [birth year]  MRN: [id number]  CC: Diabetes Follow-Up  Subjective: Tammy Ali is a 52 y.o. female who presents for diabetes follow-up.   Her concerns today include:  States she only took Metformin 3 times because caused upset stomach. She would like to try a new diabetes medication. She denies red flag symptoms associated with diabetes.  Patient Active Problem List   Diagnosis Date Noted   Hyperlipidemia 10/06/2023   Dyspareunia in female 08/27/2022   Abnormal laboratory test result 08/27/2022   Amenorrhea 08/27/2022   Blood in urine 08/27/2022   Constipation 08/27/2022   Gestational diabetes mellitus 08/27/2022   Multigravida of advanced maternal age 23/14/2023   Pain in pelvis 08/27/2022   Pain of breast 08/27/2022   Pyuria 08/27/2022   Diabetes mellitus, type 2 (HCC) 01/20/2022   Active labor at term 06/13/2016   Chronic type B viral hepatitis (HCC) 03/23/2016     Current Outpatient Medications on File Prior to Visit  Medication Sig Dispense Refill   acetaminophen (TYLENOL) 500 MG tablet Take 500 mg by mouth every 6 (six) hours as needed.     polyethylene glycol powder (GLYCOLAX/MIRALAX) 17 GM/SCOOP powder Take 17 g by mouth daily as needed. 3350 g 0   atorvastatin (LIPITOR) 20 MG tablet Take 1 tablet (20 mg total) by mouth daily. (Patient not taking: Reported on 11/02/2023) 90 tablet 0   No current facility-administered medications on file prior to visit.    No Known Allergies  Social History   Socioeconomic History   Marital status: Legally Separated    Spouse name: Not on file   Number of children: Not on file   Years of education: Not on file   Highest education level: Not on file  Occupational History   Not on file  Tobacco Use   Smoking status: Never    Passive exposure: Never   Smokeless tobacco: Never  Vaping Use   Vaping status: Never Used  Substance and Sexual Activity   Alcohol use: No   Drug use: No    Sexual activity: Yes    Birth control/protection: None  Other Topics Concern   Not on file  Social History Narrative   ** Merged History Encounter **       Social Determinants of Health   Financial Resource Strain: Not on file  Food Insecurity: Not on file  Transportation Needs: Not on file  Physical Activity: Not on file  Stress: Not on file  Social Connections: Not on file  Intimate Partner Violence: Not on file    Family History  Adopted: Yes    Past Surgical History:  Procedure Laterality Date   BRAIN SURGERY     ?crainiotomy ?shunt placement- for headaches    ROS: Review of Systems Negative except as stated above  PHYSICAL EXAM: BP 112/78   Pulse 61   Temp 98.2 F (36.8 C) (Oral)   Ht 5\' 3"  (1.6 m)   Wt 191 lb (86.6 kg)   SpO2 96%   BMI 33.83 kg/m   Physical Exam HENT:     Head: Normocephalic and atraumatic.     Nose: Nose normal.     Mouth/Throat:     Mouth: Mucous membranes are moist.     Pharynx: Oropharynx is clear.  Eyes:     Extraocular Movements: Extraocular movements intact.     Conjunctiva/sclera: Conjunctivae normal.     Pupils: Pupils are equal, round, and reactive to light.  Cardiovascular:     Rate and Rhythm: Normal rate and regular rhythm.     Pulses: Normal pulses.     Heart sounds: Normal heart sounds.  Pulmonary:     Effort: Pulmonary effort is normal.     Breath sounds: Normal breath sounds.  Musculoskeletal:        General: Normal range of motion.     Cervical back: Normal range of motion and neck supple.  Neurological:     General: No focal deficit present.     Mental Status: She is alert and oriented to person, place, and time.  Psychiatric:        Mood and Affect: Mood normal.        Behavior: Behavior normal.     ASSESSMENT AND PLAN: 1. Type 2 diabetes mellitus with hyperglycemia, without long-term current use of insulin (HCC) - Hemoglobin A1c 6.5% on 09/07/2023.  - Metformin discontinued related to  gastrointestinal side effects.  - Trial Empagliflozin as prescribed. Counseled on medication adherence/adverse effects.  - Discussed the importance of healthy eating habits, low-carbohydrate diet, low-sugar diet, regular aerobic exercise (at least 150 minutes a week as tolerated) and medication compliance to achieve or maintain control of diabetes. - Follow-up with primary provider in 4 weeks or sooner if needed.  - empagliflozin (JARDIANCE) 10 MG TABS tablet; Take 1 tablet (10 mg total) by mouth daily before breakfast.  Dispense: 30 tablet; Refill: 1  2. Language barrier - [place] in-person interpreter, Luberta Robertson.    Patient was given the opportunity to ask questions.  Patient verbalized understanding of the plan and was able to repeat key elements of the plan. Patient was given clear instructions to go to Emergency Department or return to medical center if symptoms don't improve, worsen, or new problems develop.The patient verbalized understanding.   Requested Prescriptions   Signed Prescriptions Disp Refills   empagliflozin (JARDIANCE) 10 MG TABS tablet 30 tablet 1    Sig: Take 1 tablet (10 mg total) by mouth daily before breakfast.    Return in about 4 weeks (around 11/30/2023) for Follow-Up or next available chronic conditions.  Rema Fendt, NP

## 2023-11-30 ENCOUNTER — Ambulatory Visit (INDEPENDENT_AMBULATORY_CARE_PROVIDER_SITE_OTHER): Payer: No Typology Code available for payment source | Admitting: Family

## 2023-11-30 VITALS — BP 123/82 | HR 67 | Temp 97.8°F | Ht 63.0 in | Wt 193.4 lb

## 2023-11-30 DIAGNOSIS — E1165 Type 2 diabetes mellitus with hyperglycemia: Secondary | ICD-10-CM | POA: Diagnosis not present

## 2023-11-30 DIAGNOSIS — Z603 Acculturation difficulty: Secondary | ICD-10-CM | POA: Diagnosis not present

## 2023-11-30 DIAGNOSIS — E119 Type 2 diabetes mellitus without complications: Secondary | ICD-10-CM

## 2023-11-30 DIAGNOSIS — Z758 Other problems related to medical facilities and other health care: Secondary | ICD-10-CM

## 2023-11-30 DIAGNOSIS — E785 Hyperlipidemia, unspecified: Secondary | ICD-10-CM

## 2023-11-30 DIAGNOSIS — Z7984 Long term (current) use of oral hypoglycemic drugs: Secondary | ICD-10-CM

## 2023-11-30 MED ORDER — SITAGLIPTIN PHOSPHATE 25 MG PO TABS: 25.0000 mg | ORAL_TABLET | Freq: Every day | ORAL | 1 refills | Status: DC

## 2023-11-30 MED ORDER — ATORVASTATIN CALCIUM 20 MG PO TABS: 20.0000 mg | ORAL_TABLET | Freq: Every day | ORAL | 0 refills | Status: DC

## 2023-11-30 NOTE — Progress Notes (Signed)
Patient states bad side effects when taking Jardiance.  Patient states left side of head is hurting badly all the time.

## 2023-11-30 NOTE — Progress Notes (Signed)
Patient ID: Tammy Ali, female    DOB: 1971-03-26  MRN: [id number]  CC: Chronic Conditions Follow-Up  Subjective: Tammy Ali is a 52 y.o. female who presents for chronic conditions follow-up.   Her concerns today include:  - Reports Jardiance caused upset stomach, nausea, vomiting. Reports she only took Jardiance twice. She would like to try a new diabetes medication. Denies red flag symptoms associated with diabetes.  - Doing well on Atorvastatin, no issues/concerns. Needs refills. - Established with Neurology for management of headaches.   Patient Active Problem List   Diagnosis Date Noted   Hyperlipidemia 10/06/2023   Dyspareunia in female 08/27/2022   Abnormal laboratory test result 08/27/2022   Amenorrhea 08/27/2022   Blood in urine 08/27/2022   Constipation 08/27/2022   Gestational diabetes mellitus 08/27/2022   Multigravida of advanced maternal age 58/14/2023   Pain in pelvis 08/27/2022   Pain of breast 08/27/2022   Pyuria 08/27/2022   Diabetes mellitus, type 2 (HCC) 01/20/2022   Active labor at term 06/13/2016   Chronic type B viral hepatitis (HCC) 03/23/2016     Current Outpatient Medications on File Prior to Visit  Medication Sig Dispense Refill   acetaminophen (TYLENOL) 500 MG tablet Take 500 mg by mouth every 6 (six) hours as needed.     empagliflozin (JARDIANCE) 10 MG TABS tablet Take 1 tablet (10 mg total) by mouth daily before breakfast. 30 tablet 1   polyethylene glycol powder (GLYCOLAX/MIRALAX) 17 GM/SCOOP powder Take 17 g by mouth daily as needed. 3350 g 0   No current facility-administered medications on file prior to visit.    No Known Allergies  Social History   Socioeconomic History   Marital status: Legally Separated    Spouse name: Not on file   Number of children: Not on file   Years of education: Not on file   Highest education level: Not on file  Occupational History   Not on file  Tobacco Use   Smoking status: Never    Passive  exposure: Never   Smokeless tobacco: Never  Vaping Use   Vaping status: Never Used  Substance and Sexual Activity   Alcohol use: No   Drug use: No   Sexual activity: Yes    Birth control/protection: None  Other Topics Concern   Not on file  Social History Narrative   ** Merged History Encounter **       Social Drivers of Health   Financial Resource Strain: Low Risk  (11/02/2023)   Overall Financial Resource Strain (CARDIA)    Difficulty of Paying Living Expenses: Not hard at all  Food Insecurity: No Food Insecurity (11/02/2023)   Hunger Vital Sign    Worried About Running Out of Food in the Last Year: Never true    Ran Out of Food in the Last Year: Never true  Transportation Needs: No Transportation Needs (11/02/2023)   PRAPARE - Administrator, Civil Service (Medical): No    Lack of Transportation (Non-Medical): No  Physical Activity: Inactive (11/02/2023)   Exercise Vital Sign    Days of Exercise per Week: 0 days    Minutes of Exercise per Session: 0 min  Stress: No Stress Concern Present (11/02/2023)   [employer] of Occupational Health - Occupational Stress Questionnaire    Feeling of Stress : Not at all  Social Connections: Not on file  Intimate Partner Violence: Not At Risk (11/02/2023)   Humiliation, Afraid, Rape, and Kick questionnaire    Fear  of Current or Ex-Partner: No    Emotionally Abused: No    Physically Abused: No    Sexually Abused: No    Family History  Adopted: Yes    Past Surgical History:  Procedure Laterality Date   BRAIN SURGERY     ?crainiotomy ?shunt placement- for headaches    ROS: Review of Systems Negative except as stated above  PHYSICAL EXAM: BP 123/82   Pulse 67   Temp 97.8 F (36.6 C) (Oral)   Ht 5\' 3"  (1.6 m)   Wt 193 lb 6.4 oz (87.7 kg)   SpO2 96%   BMI 34.26 kg/m   Physical Exam HENT:     Head: Normocephalic and atraumatic.     Nose: Nose normal.     Mouth/Throat:     Mouth: Mucous membranes  are moist.     Pharynx: Oropharynx is clear.  Eyes:     Extraocular Movements: Extraocular movements intact.     Conjunctiva/sclera: Conjunctivae normal.     Pupils: Pupils are equal, round, and reactive to light.  Cardiovascular:     Rate and Rhythm: Normal rate and regular rhythm.     Pulses: Normal pulses.     Heart sounds: Normal heart sounds.  Pulmonary:     Effort: Pulmonary effort is normal.     Breath sounds: Normal breath sounds.  Musculoskeletal:        General: Normal range of motion.     Cervical back: Normal range of motion and neck supple.  Neurological:     General: No focal deficit present.     Mental Status: She is alert and oriented to person, place, and time.  Psychiatric:        Mood and Affect: Mood normal.        Behavior: Behavior normal.     ASSESSMENT AND PLAN: 1. Type 2 diabetes mellitus with hyperglycemia, without long-term current use of insulin (HCC) (Primary) - Patient intolerant to Metformin and Empagliflozin.  - Trial Sitagliptin as prescribed. Counseled on medication adherence/adverse effects. - Hemoglobin A1c result pending.  - Discussed the importance of healthy eating habits, low-carbohydrate diet, low-sugar diet, regular aerobic exercise (at least 150 minutes a week as tolerated) and medication compliance to achieve or maintain control of diabetes. - Patient declined referral to Endocrinology.  - Follow-up with primary provider in 4 weeks or sooner if needed. - sitaGLIPtin (JANUVIA) 25 MG tablet; Take 1 tablet (25 mg total) by mouth daily.  Dispense: 30 tablet; Refill: 1 - POCT glycosylated hemoglobin (Hb A1C); Future  2. Diabetic eye exam [facility]) - Referral to Ophthalmology for evaluation/management. - Ambulatory referral to Ophthalmology  3. Encounter for diabetic foot exam [facility]) - Referral to Podiatry for evaluation/management.  - Ambulatory referral to Podiatry  4. Hyperlipidemia, unspecified hyperlipidemia type - Atorvastatin as  prescribed. Counseled on medication adherence/adverse effects.  - Routine screening.  - Follow-up with primary provider as scheduled.  - Lipid panel - atorvastatin (LIPITOR) 20 MG tablet; Take 1 tablet (20 mg total) by mouth daily.  Dispense: 90 tablet; Refill: 0  5. Language barrier - [place] in-person interpreter, Luberta Robertson.   Patient was given the opportunity to ask questions.  Patient verbalized understanding of the plan and was able to repeat key elements of the plan. Patient was given clear instructions to go to Emergency Department or return to medical center if symptoms don't improve, worsen, or new problems develop.The patient verbalized understanding.   Orders Placed This Encounter  Procedures  Lipid panel   Ambulatory referral to Podiatry   Ambulatory referral to Ophthalmology   POCT glycosylated hemoglobin (Hb A1C)     Requested Prescriptions   Signed Prescriptions Disp Refills   atorvastatin (LIPITOR) 20 MG tablet 90 tablet 0    Sig: Take 1 tablet (20 mg total) by mouth daily.   sitaGLIPtin (JANUVIA) 25 MG tablet 30 tablet 1    Sig: Take 1 tablet (25 mg total) by mouth daily.    Return in about 4 weeks (around 12/28/2023) for Follow-Up or next available chronic conditions.  Rema Fendt, NP

## 2023-12-01 LAB — LIPID PANEL
Chol/HDL Ratio: 3.5 {ratio} (ref 0.0–4.4)
Cholesterol, Total: 156 mg/dL (ref 100–199)
HDL: 45 mg/dL (ref >39)
LDL Chol Calc (NIH): 90 mg/dL (ref 0–99)
Triglycerides: 116 mg/dL (ref 0–149)
VLDL Cholesterol Cal: 21 mg/dL (ref 5–40)

## 2023-12-27 ENCOUNTER — Other Ambulatory Visit: Payer: Self-pay | Admitting: Family

## 2023-12-27 DIAGNOSIS — E1165 Type 2 diabetes mellitus with hyperglycemia: Secondary | ICD-10-CM

## 2023-12-27 NOTE — Telephone Encounter (Signed)
 Complete

## 2023-12-28 ENCOUNTER — Ambulatory Visit (INDEPENDENT_AMBULATORY_CARE_PROVIDER_SITE_OTHER): Payer: No Typology Code available for payment source | Admitting: Family

## 2023-12-28 VITALS — BP 128/85 | HR 65 | Temp 98.4°F | Ht 63.0 in | Wt 188.8 lb

## 2023-12-28 DIAGNOSIS — Z7984 Long term (current) use of oral hypoglycemic drugs: Secondary | ICD-10-CM | POA: Diagnosis not present

## 2023-12-28 DIAGNOSIS — E119 Type 2 diabetes mellitus without complications: Secondary | ICD-10-CM

## 2023-12-28 DIAGNOSIS — R109 Unspecified abdominal pain: Secondary | ICD-10-CM | POA: Diagnosis not present

## 2023-12-28 DIAGNOSIS — Z3202 Encounter for pregnancy test, result negative: Secondary | ICD-10-CM | POA: Diagnosis not present

## 2023-12-28 DIAGNOSIS — H9202 Otalgia, left ear: Secondary | ICD-10-CM

## 2023-12-28 DIAGNOSIS — E1165 Type 2 diabetes mellitus with hyperglycemia: Secondary | ICD-10-CM

## 2023-12-28 DIAGNOSIS — Z603 Acculturation difficulty: Secondary | ICD-10-CM

## 2023-12-28 DIAGNOSIS — Z758 Other problems related to medical facilities and other health care: Secondary | ICD-10-CM

## 2023-12-28 LAB — POCT GLYCOSYLATED HEMOGLOBIN (HGB A1C): HbA1c, POC (controlled diabetic range): 6.5 % (ref 0.0–7.0)

## 2023-12-28 LAB — POCT URINALYSIS DIP (CLINITEK)
Bilirubin, UA: NEGATIVE
Blood, UA: NEGATIVE
Glucose, UA: NEGATIVE mg/dL
Ketones, POC UA: NEGATIVE mg/dL
Nitrite, UA: NEGATIVE
POC PROTEIN,UA: NEGATIVE
Spec Grav, UA: 1.015 (ref 1.010–1.025)
Urobilinogen, UA: 0.2 U/dL
pH, UA: 6 (ref 5.0–8.0)

## 2023-12-28 LAB — POCT URINE PREGNANCY: Preg Test, Ur: NEGATIVE

## 2023-12-28 MED ORDER — SITAGLIPTIN PHOSPHATE 25 MG PO TABS: 25.0000 mg | ORAL_TABLET | Freq: Every day | ORAL | 1 refills | Status: DC

## 2023-12-28 MED ORDER — AMOXICILLIN-POT CLAVULANATE 875-125 MG PO TABS: 1.0000 | ORAL_TABLET | Freq: Two times a day (BID) | ORAL | 0 refills | Status: DC

## 2023-12-28 NOTE — Progress Notes (Signed)
 Patient states pain in lower abdomen and middle abdomen.

## 2023-12-28 NOTE — Progress Notes (Signed)
 Patient ID: Tammy Ali, female    DOB: October 01, 1971  MRN: [id number]  CC: Chronic Conditions Follow-Up  Subjective: Tammy Ali is a 53 y.o. female who presents for chronic conditions follow-up. She is accompanied by her daughter on Face Time.   Her concerns today include:  - States since previous office visit still taking Empagliflozin  due to states pharmacy said they did not have Sitagliptin  for pickup. Denies red flag symptoms associated with diabetes.  - Stomach pain persisting. Denies red flag symptoms.  - Left ear pain. Denies red flag symptoms.    Patient Active Problem List   Diagnosis Date Noted   Hyperlipidemia 10/06/2023   Dyspareunia in female 08/27/2022   Abnormal laboratory test result 08/27/2022   Amenorrhea 08/27/2022   Blood in urine 08/27/2022   Constipation 08/27/2022   Gestational diabetes mellitus 08/27/2022   Multigravida of advanced maternal age 65/14/2023   Pain in pelvis 08/27/2022   Pain of breast 08/27/2022   Pyuria 08/27/2022   Diabetes mellitus, type 2 (HCC) 01/20/2022   Active labor at term 06/13/2016   Chronic type B viral hepatitis (HCC) 03/23/2016     Current Outpatient Medications on File Prior to Visit  Medication Sig Dispense Refill   acetaminophen  (TYLENOL ) 500 MG tablet Take 500 mg by mouth every 6 (six) hours as needed.     JARDIANCE  10 MG TABS tablet TAKE 1 TABLET BY MOUTH DAILY BEFORE BREAKFAST. 30 tablet 1   atorvastatin  (LIPITOR) 20 MG tablet Take 1 tablet (20 mg total) by mouth daily. (Patient not taking: Reported on 12/28/2023) 90 tablet 0   polyethylene glycol powder (GLYCOLAX /MIRALAX ) 17 GM/SCOOP powder Take 17 g by mouth daily as needed. (Patient not taking: Reported on 12/28/2023) 3350 g 0   No current facility-administered medications on file prior to visit.    No Known Allergies  Social History   Socioeconomic History   Marital status: Legally Separated    Spouse name: Not on file   Number of children: Not on file   Years  of education: Not on file   Highest education level: Not on file  Occupational History   Not on file  Tobacco Use   Smoking status: Never    Passive exposure: Never   Smokeless tobacco: Never  Vaping Use   Vaping status: Never Used  Substance and Sexual Activity   Alcohol use: No   Drug use: No   Sexual activity: Yes    Birth control/protection: None  Other Topics Concern   Not on file  Social History Narrative   ** Merged History Encounter **       Social Drivers of Health   Financial Resource Strain: Low Risk  (11/02/2023)   Overall Financial Resource Strain (CARDIA)    Difficulty of Paying Living Expenses: Not hard at all  Food Insecurity: No Food Insecurity (11/02/2023)   Hunger Vital Sign    Worried About Running Out of Food in the Last Year: Never true    Ran Out of Food in the Last Year: Never true  Transportation Needs: No Transportation Needs (11/02/2023)   PRAPARE - Administrator, Civil Service (Medical): No    Lack of Transportation (Non-Medical): No  Physical Activity: Inactive (11/02/2023)   Exercise Vital Sign    Days of Exercise per Week: 0 days    Minutes of Exercise per Session: 0 min  Stress: No Stress Concern Present (11/02/2023)   [employer] of Occupational Health - Occupational Stress Questionnaire  Feeling of Stress : Not at all  Social Connections: Not on file  Intimate Partner Violence: Not At Risk (11/02/2023)   Humiliation, Afraid, Rape, and Kick questionnaire    Fear of Current or Ex-Partner: No    Emotionally Abused: No    Physically Abused: No    Sexually Abused: No    Family History  Adopted: Yes    Past Surgical History:  Procedure Laterality Date   BRAIN SURGERY     ?crainiotomy ?shunt placement- for headaches    ROS: Review of Systems Negative except as stated above  PHYSICAL EXAM: BP 128/85   Pulse 65   Temp 98.4 F (36.9 C) (Oral)   Ht 5' 3 (1.6 m)   Wt 188 lb 12.8 oz (85.6 kg)   SpO2 98%    BMI 33.44 kg/m   Physical Exam HENT:     Head: Normocephalic and atraumatic.     Right Ear: Tympanic membrane, ear canal and external ear normal.     Left Ear: Ear canal and external ear normal. Tympanic membrane is erythematous.     Nose: Nose normal.     Mouth/Throat:     Mouth: Mucous membranes are moist.     Pharynx: Oropharynx is clear.  Eyes:     Extraocular Movements: Extraocular movements intact.     Conjunctiva/sclera: Conjunctivae normal.     Pupils: Pupils are equal, round, and reactive to light.  Cardiovascular:     Rate and Rhythm: Normal rate and regular rhythm.     Pulses: Normal pulses.     Heart sounds: Normal heart sounds.  Pulmonary:     Effort: Pulmonary effort is normal.     Breath sounds: Normal breath sounds.  Abdominal:     General: Bowel sounds are normal.     Palpations: Abdomen is soft.  Musculoskeletal:        General: Normal range of motion.     Cervical back: Normal range of motion and neck supple.  Neurological:     General: No focal deficit present.     Mental Status: She is alert and oriented to person, place, and time.  Psychiatric:        Mood and Affect: Mood normal.        Behavior: Behavior normal.     ASSESSMENT AND PLAN: 1. Type 2 diabetes mellitus with hyperglycemia, without long-term current use of insulin (HCC) (Primary) - Begin Sitagliptin  as prescribed. Counseled on medication adherence/adverse effects.  - Hemoglobin A1c result pending.  - Discussed the importance of healthy eating habits, low-carbohydrate diet, low-sugar diet, regular aerobic exercise (at least 150 minutes a week as tolerated) and medication compliance to achieve or maintain control of diabetes. - Follow-up with primary provider in 4 weeks or sooner if needed.  - sitaGLIPtin  (JANUVIA ) 25 MG tablet; Take 1 tablet (25 mg total) by mouth daily.  Dispense: 30 tablet; Refill: 1 - POCT glycosylated hemoglobin (Hb A1C); Future  2. Diabetic eye exam [facility])  -  Referral to Ophthalmology for evaluation/management.  - Ambulatory referral to Ophthalmology  3. Encounter for diabetic foot exam [facility]) - Referral to Podiatry for evaluation/management.  - Ambulatory referral to Podiatry  4. Left ear pain - Amoxicillin -Clavulanate as prescribed. Counseled on medication adherence/adverse effects. - Follow-up with primary provider as scheduled.  - amoxicillin -clavulanate (AUGMENTIN ) 875-125 MG tablet; Take 1 tablet by mouth 2 (two) times daily.  Dispense: 20 tablet; Refill: 0  5. Abdominal pain, unspecified abdominal location - Routine screening.  -  Referral to Gastroenterology for evaluation/management.  - Ambulatory referral to Gastroenterology - CMP14+EGFR - CBC - Amylase - Lipase - POCT urine pregnancy; Future - POCT URINALYSIS DIP (CLINITEK); Future - Cervicovaginal ancillary only  6. Language barrier - Patient accompanied by her daughter on Face Time who serves as interpreter and part-historian.   Patient was given the opportunity to ask questions.  Patient verbalized understanding of the plan and was able to repeat key elements of the plan. Patient was given clear instructions to go to Emergency Department or return to medical center if symptoms don't improve, worsen, or new problems develop.The patient verbalized understanding.   Orders Placed This Encounter  Procedures   CMP14+EGFR   CBC   Amylase   Lipase   Ambulatory referral to Ophthalmology   Ambulatory referral to Podiatry   Ambulatory referral to Gastroenterology   POCT glycosylated hemoglobin (Hb A1C)   POCT urine pregnancy   POCT URINALYSIS DIP (CLINITEK)     Requested Prescriptions   Signed Prescriptions Disp Refills   sitaGLIPtin  (JANUVIA ) 25 MG tablet 30 tablet 1    Sig: Take 1 tablet (25 mg total) by mouth daily.   amoxicillin -clavulanate (AUGMENTIN ) 875-125 MG tablet 20 tablet 0    Sig: Take 1 tablet by mouth 2 (two) times daily.    Return in about 4 weeks  (around 01/25/2024) for Follow-Up or next available chronic conditions.  Greig JINNY Drones, NP

## 2023-12-29 LAB — CMP14+EGFR
ALT: 25 [IU]/L (ref 0–32)
AST: 23 [IU]/L (ref 0–40)
Albumin: 4.4 g/dL (ref 3.8–4.9)
Alkaline Phosphatase: 120 [IU]/L (ref 44–121)
BUN/Creatinine Ratio: 21 (ref 9–23)
BUN: 13 mg/dL (ref 6–24)
Bilirubin Total: 0.3 mg/dL (ref 0.0–1.2)
CO2: 24 mmol/L (ref 20–29)
Calcium: 8.9 mg/dL (ref 8.7–10.2)
Chloride: 106 mmol/L (ref 96–106)
Creatinine, Ser: 0.62 mg/dL (ref 0.57–1.00)
Globulin, Total: 2.9 g/dL (ref 1.5–4.5)
Glucose: 100 mg/dL — ABNORMAL HIGH (ref 70–99)
Potassium: 4.8 mmol/L (ref 3.5–5.2)
Sodium: 142 mmol/L (ref 134–144)
Total Protein: 7.3 g/dL (ref 6.0–8.5)
eGFR: 107 mL/min/{1.73_m2} (ref >59)

## 2023-12-29 LAB — CBC
Hematocrit: 40.3 % (ref 34.0–46.6)
Hemoglobin: 10.8 g/dL — ABNORMAL LOW (ref 11.1–15.9)
MCH: 16.4 pg — ABNORMAL LOW (ref 26.6–33.0)
MCHC: 26.8 g/dL — ABNORMAL LOW (ref 31.5–35.7)
MCV: 61 fL — ABNORMAL LOW (ref 79–97)
Platelets: 274 10*3/uL (ref 150–450)
RBC: 6.6 x10E6/uL — ABNORMAL HIGH (ref 3.77–5.28)
RDW: 21.1 % — ABNORMAL HIGH (ref 11.7–15.4)
WBC: 6.9 10*3/uL (ref 3.4–10.8)

## 2023-12-29 LAB — LIPASE: Lipase: 29 U/L (ref 14–72)

## 2023-12-29 LAB — AMYLASE: Amylase: 28 U/L — ABNORMAL LOW (ref 31–110)

## 2024-01-03 ENCOUNTER — Encounter: Payer: Self-pay | Admitting: *Deleted

## 2024-01-09 ENCOUNTER — Ambulatory Visit
Admission: EM | Admit: 2024-01-09 | Discharge: 2024-01-09 | Disposition: A | Discharge status: Home / Self Care | Payer: No Typology Code available for payment source | Attending: Family Medicine | Admitting: Family Medicine

## 2024-01-09 ENCOUNTER — Other Ambulatory Visit: Payer: Self-pay

## 2024-01-09 DIAGNOSIS — J09X2 Influenza due to identified novel influenza A virus with other respiratory manifestations: Secondary | ICD-10-CM

## 2024-01-09 LAB — POCT INFLUENZA A/B
Influenza A, POC: POSITIVE — AB
Influenza B, POC: NEGATIVE

## 2024-01-09 MED ORDER — PROMETHAZINE-DM 6.25-15 MG/5ML PO SYRP: 5.0000 mL | ORAL_SOLUTION | Freq: Four times a day (QID) | ORAL | 0 refills | Status: AC | PRN

## 2024-01-09 NOTE — ED Triage Notes (Addendum)
Pt presents with complaints of productive cough, headaches, generalized body aches, fevers & chills x 3 days. Pt states her overall pain is an 8/10. Pt is alternating Tylenol and Ibuprofen with no relief. Pt has been exposed to Flu in living environment by grandchildren.

## 2024-01-09 NOTE — ED Provider Notes (Signed)
[name]    CSN: [id number] Arrival date & time: 01/09/24  0909      History   Chief Complaint Chief Complaint  Patient presents with   Fever    HPI Tammy Ali is a 53 y.o. female.   The history is provided by the patient. The history is limited by a language barrier.  Fever Associated symptoms: chills, congestion, cough, headaches, nausea and rhinorrhea   Associated symptoms: no chest pain, no diarrhea, no ear pain, no sore throat and no vomiting    Patient speaks Montagnard, declined Nurse, learning disability, daughter is translating Patient has been sick for 3 days with fever, body aches, fatigue, nasal congestion, rhinorrhea, cough, nausea.  Multiple family contacts with flu. Denies sore throat, ear pain, chest pain, shortness of breath, vomiting, diarrhea. Past medical history type 2 diabetes treated with Glucophage and Januvia.  No known drug allergies. Been taking Tylenol and Advil.  Past Medical History:  Diagnosis Date   Anemia    GDM (gestational diabetes mellitus)    Hepatitis B carrier [facility])     Patient Active Problem List   Diagnosis Date Noted   Hyperlipidemia 10/06/2023   Dyspareunia in female 08/27/2022   Abnormal laboratory test result 08/27/2022   Amenorrhea 08/27/2022   Blood in urine 08/27/2022   Constipation 08/27/2022   Gestational diabetes mellitus 08/27/2022   Multigravida of advanced maternal age 40/14/2023   Pain in pelvis 08/27/2022   Pain of breast 08/27/2022   Pyuria 08/27/2022   Diabetes mellitus, type 2 (HCC) 01/20/2022   Active labor at term 06/13/2016   Chronic type B viral hepatitis (HCC) 03/23/2016    Past Surgical History:  Procedure Laterality Date   BRAIN SURGERY     ?crainiotomy ?shunt placement- for headaches    OB History     Gravida  7   Para  6   Term  5   Preterm  1   AB  1   Living  1      SAB  1   IAB  0   Ectopic  0   Multiple      Live Births  1            Home Medications     Prior to Admission medications   Medication Sig Start Date End Date Taking? Authorizing Provider  metFORMIN (GLUCOPHAGE) 500 MG tablet Take 500 mg by mouth 2 (two) times daily. 12/31/23  Yes [provider]  acetaminophen (TYLENOL) 500 MG tablet Take 500 mg by mouth every 6 (six) hours as needed.    [provider]  amoxicillin-clavulanate (AUGMENTIN) 875-125 MG tablet Take 1 tablet by mouth 2 (two) times daily. 12/28/23   Rema Fendt, NP  atorvastatin (LIPITOR) 20 MG tablet Take 1 tablet (20 mg total) by mouth daily. Patient not taking: Reported on 12/28/2023 11/30/23   Rema Fendt, NP  JARDIANCE 10 MG TABS tablet TAKE 1 TABLET BY MOUTH DAILY BEFORE BREAKFAST. 12/27/23   Rema Fendt, NP  polyethylene glycol powder (GLYCOLAX/MIRALAX) 17 GM/SCOOP powder Take 17 g by mouth daily as needed. Patient not taking: Reported on 12/28/2023 09/07/23   Rema Fendt, NP  sitaGLIPtin (JANUVIA) 25 MG tablet Take 1 tablet (25 mg total) by mouth daily. 12/28/23   Rema Fendt, NP    Family History Family History  Adopted: Yes    Social History Social History   Tobacco Use   Smoking status: Never    Passive exposure: Never  Smokeless tobacco: Never  Vaping Use   Vaping status: Never Used  Substance Use Topics   Alcohol use: No   Drug use: No     Allergies   Patient has no known allergies.   Review of Systems Review of Systems  Constitutional:  Positive for appetite change, chills, fatigue and fever. Negative for diaphoresis.  HENT:  Positive for congestion and rhinorrhea. Negative for ear pain and sore throat.   Respiratory:  Positive for cough. Negative for shortness of breath.   Cardiovascular:  Negative for chest pain.  Gastrointestinal:  Positive for nausea. Negative for diarrhea and vomiting.  Neurological:  Positive for headaches.     Physical Exam Triage Vital Signs ED Triage Vitals  Encounter Vitals Group     BP 01/09/24 0918 103/70      Systolic BP Percentile --      Diastolic BP Percentile --      Pulse Rate 01/09/24 0918 91     Resp 01/09/24 0918 16     Temp 01/09/24 0918 100.3 F (37.9 C)     Temp Source 01/09/24 0918 Oral     SpO2 01/09/24 0918 95 %     Weight 01/09/24 0918 190 lb (86.2 kg)     Height --      Head Circumference --      Peak Flow --      Pain Score 01/09/24 0924 8     Pain Loc --      Pain Education --      Exclude from Growth Chart --    No data found.  Updated Vital Signs BP 103/70 (BP Location: Right Arm)   Pulse 91   Temp 100.3 F (37.9 C) (Oral)   Resp 16   Wt 190 lb (86.2 kg)   LMP 12/08/2023 (Exact Date)   SpO2 95%   BMI 33.66 kg/m   Visual Acuity Right Eye Distance:   Left Eye Distance:   Bilateral Distance:    Right Eye Near:   Left Eye Near:    Bilateral Near:     Physical Exam Constitutional:      Comments: Appears to not feel well but not toxic appearing  HENT:     Head: Normocephalic and atraumatic.     Right Ear: Tympanic membrane and ear canal normal.     Left Ear: Tympanic membrane and ear canal normal.     Nose: Congestion present. No rhinorrhea.     Mouth/Throat:     Mouth: Mucous membranes are moist.     Pharynx: Oropharynx is clear. No oropharyngeal exudate or posterior oropharyngeal erythema.  Eyes:     General:        Right eye: No discharge.        Left eye: No discharge.     Conjunctiva/sclera: Conjunctivae normal.  Cardiovascular:     Rate and Rhythm: Normal rate and regular rhythm.     Heart sounds: Normal heart sounds.  Pulmonary:     Effort: Pulmonary effort is normal. No respiratory distress.     Breath sounds: Normal breath sounds. No wheezing.  Musculoskeletal:     Cervical back: Neck supple.  Lymphadenopathy:     Cervical: No cervical adenopathy.  Skin:    General: Skin is warm.  Neurological:     Mental Status: She is alert.  Psychiatric:        Mood and Affect: Mood normal.        Behavior: Behavior normal.  UC  Treatments / Results  Labs (all labs ordered are listed, but only abnormal results are displayed) Labs Reviewed  POCT INFLUENZA A/B    EKG   Radiology No results found.  Procedures Procedures (including critical care time)  Medications Ordered in UC Medications - No data to display  Initial Impression / Assessment and Plan / UC Course  I have reviewed the triage vital signs and the nursing notes.  Pertinent labs & imaging results that were available during my care of the patient were reviewed by me and considered in my medical decision making (see chart for details).     53 year old female with history of type 2 diabetes with recent flu exposure presents with flulike symptoms for 3 days Appears to not feel well but nontoxic, has low-grade fever to 100.3 otherwise vital signs are stable.  No evidence of bacterial infection on exam, lungs are clear to auscultation without rales rhonchi or wheezing.  She is outside the window for benefit from Tamiflu will Rx cough medicine recommend OTC remedies for symptomatic relief warning signs and follow-up reviewed Final Clinical Impressions(s) / UC Diagnoses   Final diagnoses:  None   Discharge Instructions   None    ED Prescriptions   None    PDMP not reviewed this encounter.   Meliton Rattan, [place] 01/09/24 [phone]

## 2024-01-09 NOTE — Discharge Instructions (Addendum)
Continue Tylenol and Advil Rest, increase fluid intake Herbal treatments that have been shown to be helpful in some patients include: Vitamin C 1000 mg per day Zinc 100 mg per day Quercetin 25-500 mg twice a day Melatonin 5-10mg  at bedtime Honey Green Tea   General Instructions Allow your body to rest Drink PLENTY of fluids Typically, we are the most contagious 1-2 days before symptoms start through the first 2-3 days of most severe symptoms. Per CDC guidelines, you can return to school/work when symptoms have started to improve and you have been fever-free for 24 hours. However, recommend you continue extra precautions for the following 5 days (frequent hand hygiene, masking, covering coughs/sneezes, minimize exposure to immunocompromised individuals, etc).

## 2024-01-11 ENCOUNTER — Ambulatory Visit (INDEPENDENT_AMBULATORY_CARE_PROVIDER_SITE_OTHER): Payer: No Typology Code available for payment source

## 2024-01-11 ENCOUNTER — Ambulatory Visit (INDEPENDENT_AMBULATORY_CARE_PROVIDER_SITE_OTHER): Payer: No Typology Code available for payment source | Admitting: Podiatry

## 2024-01-11 DIAGNOSIS — E119 Type 2 diabetes mellitus without complications: Secondary | ICD-10-CM | POA: Diagnosis not present

## 2024-01-11 NOTE — Progress Notes (Signed)
  Subjective:  Patient ID: Tammy Ali, female    DOB: 09-03-1971,   MRN: [id number]  Chief Complaint  Patient presents with   Nail Problem    DFC.    53 y.o. female presents for diabetic foot check. Relates numbness in her legs but denies any issues in her feet. . Patient is diabetic and last A1c was  Lab Results  Component Value Date   HGBA1C 6.5 12/28/2023   .   PCP:  Rema Fendt, NP    . Denies any other pedal complaints. Denies n/v/f/c.   Past Medical History:  Diagnosis Date   Anemia    GDM (gestational diabetes mellitus)    Hepatitis B carrier (HCC)     Objective:  Physical Exam: Vascular: DP/PT pulses 2/4 bilateral. CFT <3 seconds. Normal hair growth on digits. No edema.  Skin. No lacerations or abrasions bilateral feet. Nails 1-5 normal in appearance bilateral Musculoskeletal: MMT 5/5 bilateral lower extremities in DF, PF, Inversion and Eversion. Deceased ROM in DF of ankle joint.  Neurological: Sensation intact to light touch. Protective sensation intact.   Assessment:   1. Type 2 diabetes mellitus without complication, without Ali-term current use of insulin (HCC)      Plan:  Patient was evaluated and treated and all questions answered. -Discussed and educated patient on diabetic foot care, especially with  regards to the vascular, neurological and musculoskeletal systems.  -Stressed the importance of good glycemic control and the detriment of not  controlling glucose levels in relation to the foot. -Discussed supportive shoes at all times and checking feet regularly.  -Advised on compression stockings for swelling.  -Answered all patient questions -Patient to return in 1 year for DM foot check  -Patient advised to call the office if any problems or questions arise in the meantime.   Louann Sjogren, DPM

## 2024-01-25 ENCOUNTER — Ambulatory Visit (INDEPENDENT_AMBULATORY_CARE_PROVIDER_SITE_OTHER): Payer: No Typology Code available for payment source | Admitting: Family

## 2024-01-25 VITALS — BP 112/77 | HR 67 | Temp 98.6°F | Ht 63.0 in | Wt 187.6 lb

## 2024-01-25 DIAGNOSIS — E785 Hyperlipidemia, unspecified: Secondary | ICD-10-CM

## 2024-01-25 DIAGNOSIS — E1165 Type 2 diabetes mellitus with hyperglycemia: Secondary | ICD-10-CM | POA: Diagnosis not present

## 2024-01-25 DIAGNOSIS — Z7984 Long term (current) use of oral hypoglycemic drugs: Secondary | ICD-10-CM

## 2024-01-25 DIAGNOSIS — Z758 Other problems related to medical facilities and other health care: Secondary | ICD-10-CM | POA: Diagnosis not present

## 2024-01-25 DIAGNOSIS — Z603 Acculturation difficulty: Secondary | ICD-10-CM

## 2024-01-25 MED ORDER — ATORVASTATIN CALCIUM 20 MG PO TABS: 20.0000 mg | ORAL_TABLET | Freq: Every day | ORAL | 0 refills | Status: DC

## 2024-01-25 MED ORDER — SITAGLIPTIN PHOSPHATE 25 MG PO TABS: 25.0000 mg | ORAL_TABLET | Freq: Every day | ORAL | 0 refills | Status: DC

## 2024-01-25 NOTE — Progress Notes (Signed)
Patient states no other cpncerns to discuss.

## 2024-01-25 NOTE — Progress Notes (Signed)
Patient ID: Tammy Ali, female    DOB: [birth year]  MRN: [id number]  CC: Chronic Conditions Follow-Up  Subjective: Tammy Ali is a 53 y.o. female who presents for chronic conditions follow-up.   Her concerns today include:  - Doing well on Sitagliptin, no issues/concerns. Denies red flag symptoms associated with diabetes. - Doing well on Atorvastatin, no issues/concerns.  - Declines labs on today.  Patient Active Problem List   Diagnosis Date Noted   Hyperlipidemia 10/06/2023   Dyspareunia in female 08/27/2022   Abnormal laboratory test result 08/27/2022   Amenorrhea 08/27/2022   Blood in urine 08/27/2022   Constipation 08/27/2022   Gestational diabetes mellitus 08/27/2022   Multigravida of advanced maternal age 47/14/2023   Pain in pelvis 08/27/2022   Pain of breast 08/27/2022   Pyuria 08/27/2022   Diabetes mellitus, type 2 (HCC) 01/20/2022   Active labor at term 06/13/2016   Chronic type B viral hepatitis (HCC) 03/23/2016     Current Outpatient Medications on File Prior to Visit  Medication Sig Dispense Refill   acetaminophen (TYLENOL) 500 MG tablet Take 500 mg by mouth every 6 (six) hours as needed.     JARDIANCE 10 MG TABS tablet TAKE 1 TABLET BY MOUTH DAILY BEFORE BREAKFAST. 30 tablet 1   polyethylene glycol powder (GLYCOLAX/MIRALAX) 17 GM/SCOOP powder Take 17 g by mouth daily as needed. 3350 g 0   metFORMIN (GLUCOPHAGE) 500 MG tablet Take 500 mg by mouth 2 (two) times daily. (Patient not taking: Reported on 01/25/2024)     No current facility-administered medications on file prior to visit.    No Known Allergies  Social History   Socioeconomic History   Marital status: Legally Separated    Spouse name: Not on file   Number of children: Not on file   Years of education: Not on file   Highest education level: Not on file  Occupational History   Not on file  Tobacco Use   Smoking status: Never    Passive exposure: Never   Smokeless tobacco: Never  Vaping Use    Vaping status: Never Used  Substance and Sexual Activity   Alcohol use: No   Drug use: No   Sexual activity: Yes    Birth control/protection: None  Other Topics Concern   Not on file  Social History Narrative   ** Merged History Encounter **       Social Drivers of Health   Financial Resource Strain: Low Risk  (11/02/2023)   Overall Financial Resource Strain (CARDIA)    Difficulty of Paying Living Expenses: Not hard at all  Food Insecurity: No Food Insecurity (11/02/2023)   Hunger Vital Sign    Worried About Running Out of Food in the Last Year: Never true    Ran Out of Food in the Last Year: Never true  Transportation Needs: No Transportation Needs (11/02/2023)   PRAPARE - Administrator, Civil Service (Medical): No    Lack of Transportation (Non-Medical): No  Physical Activity: Inactive (11/02/2023)   Exercise Vital Sign    Days of Exercise per Week: 0 days    Minutes of Exercise per Session: 0 min  Stress: No Stress Concern Present (11/02/2023)   [employer] of Occupational Health - Occupational Stress Questionnaire    Feeling of Stress : Not at all  Social Connections: Not on file  Intimate Partner Violence: Not At Risk (11/02/2023)   Humiliation, Afraid, Rape, and Kick questionnaire    Fear of Current  or Ex-Partner: No    Emotionally Abused: No    Physically Abused: No    Sexually Abused: No    Family History  Adopted: Yes    Past Surgical History:  Procedure Laterality Date   BRAIN SURGERY     ?crainiotomy ?shunt placement- for headaches    ROS: Review of Systems Negative except as stated above  PHYSICAL EXAM: BP 112/77   Pulse 67   Temp 98.6 F (37 C) (Oral)   Ht 5\' 3"  (1.6 m)   Wt 187 lb 9.6 oz (85.1 kg)   LMP 12/08/2023 (Exact Date)   SpO2 98%   BMI 33.23 kg/m   Physical Exam HENT:     Head: Normocephalic and atraumatic.     Nose: Nose normal.     Mouth/Throat:     Mouth: Mucous membranes are moist.     Pharynx:  Oropharynx is clear.  Eyes:     Extraocular Movements: Extraocular movements intact.     Conjunctiva/sclera: Conjunctivae normal.     Pupils: Pupils are equal, round, and reactive to light.  Cardiovascular:     Rate and Rhythm: Normal rate and regular rhythm.     Pulses: Normal pulses.     Heart sounds: Normal heart sounds.  Pulmonary:     Effort: Pulmonary effort is normal.     Breath sounds: Normal breath sounds.  Musculoskeletal:        General: Normal range of motion.     Cervical back: Normal range of motion and neck supple.  Neurological:     General: No focal deficit present.     Mental Status: She is alert and oriented to person, place, and time.  Psychiatric:        Mood and Affect: Mood normal.        Behavior: Behavior normal.     ASSESSMENT AND PLAN: 1. Type 2 diabetes mellitus with hyperglycemia, without long-term current use of insulin (HCC) (Primary) - Hemoglobin A1c at goal at 6.5% on 12/28/2023. Goal 7%.  - Continue Sitagliptin as prescribed. Counseled on medication adherence/adverse effects.  - Discussed the importance of healthy eating habits, low-carbohydrate diet, low-sugar diet, regular aerobic exercise (at least 150 minutes a week as tolerated) and medication compliance to achieve or maintain control of diabetes. - Follow-up with primary provider 8 weeks or sooner if needed.  - sitaGLIPtin (JANUVIA) 25 MG tablet; Take 1 tablet (25 mg total) by mouth daily.  Dispense: 90 tablet; Refill: 0  2. Hyperlipidemia, unspecified hyperlipidemia type - Continue Atorvastatin as prescribed. Counseled on medication adherence/adverse effects.  - Follow-up with primary provider in 8 weeks or sooner if needed.  - atorvastatin (LIPITOR) 20 MG tablet; Take 1 tablet (20 mg total) by mouth daily.  Dispense: 90 tablet; Refill: 0  3. Language barrier - [place] in-person interpreter, Luberta Robertson.    Patient was given the opportunity to ask questions.  Patient verbalized  understanding of the plan and was able to repeat key elements of the plan. Patient was given clear instructions to go to Emergency Department or return to medical center if symptoms don't improve, worsen, or new problems develop.The patient verbalized understanding.   Requested Prescriptions   Signed Prescriptions Disp Refills   atorvastatin (LIPITOR) 20 MG tablet 90 tablet 0    Sig: Take 1 tablet (20 mg total) by mouth daily.   sitaGLIPtin (JANUVIA) 25 MG tablet 90 tablet 0    Sig: Take 1 tablet (25 mg total) by mouth daily.  Return in about 2 months (around 03/24/2024) for Follow-Up or next available chronic conditions.  Rema Fendt, NP

## 2024-01-30 ENCOUNTER — Ambulatory Visit
Admission: EM | Admit: 2024-01-30 | Discharge: 2024-01-30 | Disposition: A | Discharge status: Home / Self Care | Payer: No Typology Code available for payment source | Attending: Internal Medicine | Admitting: Internal Medicine

## 2024-01-30 ENCOUNTER — Encounter: Payer: Self-pay | Admitting: Emergency Medicine

## 2024-01-30 DIAGNOSIS — J069 Acute upper respiratory infection, unspecified: Secondary | ICD-10-CM | POA: Diagnosis not present

## 2024-01-30 MED ORDER — PROMETHAZINE-DM 6.25-15 MG/5ML PO SYRP: 5.0000 mL | ORAL_SOLUTION | Freq: Every evening | ORAL | 0 refills | Status: AC | PRN

## 2024-01-30 MED ORDER — GUAIFENESIN ER 1200 MG PO TB12: 1200.0000 mg | ORAL_TABLET | Freq: Two times a day (BID) | ORAL | 0 refills | Status: AC

## 2024-01-30 NOTE — ED Triage Notes (Signed)
Pt c/o coughing, sore throat, congestion. She was seen 1/26 and began to feel better but this week here symptoms began again.

## 2024-01-30 NOTE — ED Provider Notes (Signed)
[name]    CSN: [id number] Arrival date & time: 01/30/24  0818      History   Chief Complaint Chief Complaint  Patient presents with   Cough    HPI Tammy Ali is a 53 y.o. female.   Patient presents to urgent care with her daughter who contributes to the history and provides interpretation with patient's consent for evaluation of cough, nasal congestion, generalized fatigue, and generalized frontal headache that started 5 days ago on January 25, 2024. She was sick 3 weeks ago with similar symptoms which fully resolved. Cough returned 5 days ago and she believes this is a new viral illness. Cough is productive with green/yellow mucous. Denies N/V/D, abdominal pain, dizziness, vision changes, shortness of breath, chest pain, and palpitations. Never smoker, denies history of chronic respiratory problems. Denies recent fevers/chills. Taking tylenol with some relief.   The history is provided by the patient and a relative. A language interpreter was used (Daugher at bedside provides interpretation, offered professional medical interpreter, patient declines).  Cough   Past Medical History:  Diagnosis Date   Anemia    GDM (gestational diabetes mellitus)    Hepatitis B carrier [facility]     Patient Active Problem List   Diagnosis Date Noted   Hyperlipidemia 10/06/2023   Dyspareunia in female 08/27/2022   Abnormal laboratory test result 08/27/2022   Amenorrhea 08/27/2022   Blood in urine 08/27/2022   Constipation 08/27/2022   Gestational diabetes mellitus 08/27/2022   Multigravida of advanced maternal age 41/14/2023   Pain in pelvis 08/27/2022   Pain of breast 08/27/2022   Pyuria 08/27/2022   Diabetes mellitus, type 2 (HCC) 01/20/2022   Active labor at term 06/13/2016   Chronic type B viral hepatitis (HCC) 03/23/2016    Past Surgical History:  Procedure Laterality Date   BRAIN SURGERY     ?crainiotomy ?shunt placement- for headaches    OB History      Gravida  7   Para  6   Term  5   Preterm  1   AB  1   Living  1      SAB  1   IAB  0   Ectopic  0   Multiple      Live Births  1            Home Medications    Prior to Admission medications   Medication Sig Start Date End Date Taking? Authorizing Provider  Guaifenesin 1200 MG TB12 Take 1 tablet (1,200 mg total) by mouth in the morning and at bedtime. 01/30/24  Yes Carlisle Beers, FNP  promethazine-dextromethorphan (PROMETHAZINE-DM) 6.25-15 MG/5ML syrup Take 5 mLs by mouth at bedtime as needed for cough. 01/30/24  Yes Carlisle Beers, FNP  acetaminophen (TYLENOL) 500 MG tablet Take 500 mg by mouth every 6 (six) hours as needed.    [provider]  atorvastatin (LIPITOR) 20 MG tablet Take 1 tablet (20 mg total) by mouth daily. 01/25/24   Rema Fendt, NP  JARDIANCE 10 MG TABS tablet TAKE 1 TABLET BY MOUTH DAILY BEFORE BREAKFAST. 12/27/23   Rema Fendt, NP  metFORMIN (GLUCOPHAGE) 500 MG tablet Take 500 mg by mouth 2 (two) times daily. Patient not taking: Reported on 01/25/2024 12/31/23   [provider]  polyethylene glycol powder (GLYCOLAX/MIRALAX) 17 GM/SCOOP powder Take 17 g by mouth daily as needed. 09/07/23   Rema Fendt, NP  sitaGLIPtin (JANUVIA) 25 MG tablet Take 1 tablet (25  mg total) by mouth daily. 01/25/24 04/24/24  Rema Fendt, NP    Family History Family History  Adopted: Yes    Social History Social History   Tobacco Use   Smoking status: Never    Passive exposure: Never   Smokeless tobacco: Never  Vaping Use   Vaping status: Never Used  Substance Use Topics   Alcohol use: No   Drug use: No     Allergies   Patient has no known allergies.   Review of Systems Review of Systems  Respiratory:  Positive for cough.   Per HPI   Physical Exam Triage Vital Signs ED Triage Vitals  Encounter Vitals Group     BP 01/30/24 0826 117/78     Systolic BP Percentile --      Diastolic BP Percentile --       Pulse Rate 01/30/24 0826 67     Resp 01/30/24 0826 17     Temp 01/30/24 0826 98.1 F (36.7 C)     Temp Source 01/30/24 0826 Oral     SpO2 01/30/24 0826 95 %     Weight --      Height --      Head Circumference --      Peak Flow --      Pain Score 01/30/24 0828 8     Pain Loc --      Pain Education --      Exclude from Growth Chart --    No data found.  Updated Vital Signs BP 117/78 (BP Location: Right Arm)   Pulse 67   Temp 98.1 F (36.7 C) (Oral)   Resp 17   LMP 12/08/2023 (Exact Date)   SpO2 95%   Visual Acuity Right Eye Distance:   Left Eye Distance:   Bilateral Distance:    Right Eye Near:   Left Eye Near:    Bilateral Near:     Physical Exam Vitals and nursing note reviewed.  Constitutional:      Appearance: She is not ill-appearing or toxic-appearing.  HENT:     Head: Normocephalic and atraumatic.     Right Ear: Hearing, tympanic membrane, ear canal and external ear normal.     Left Ear: Hearing, tympanic membrane, ear canal and external ear normal.     Nose: Congestion present.     Mouth/Throat:     Lips: Pink.     Mouth: Mucous membranes are moist. No injury or oral lesions.     Dentition: Normal dentition.     Tongue: No lesions.     Pharynx: Oropharynx is clear. Uvula midline. No pharyngeal swelling, oropharyngeal exudate, posterior oropharyngeal erythema, uvula swelling or postnasal drip.     Tonsils: No tonsillar exudate.  Eyes:     General: Lids are normal. Vision grossly intact. Gaze aligned appropriately.     Extraocular Movements: Extraocular movements intact.     Conjunctiva/sclera: Conjunctivae normal.  Neck:     Trachea: Trachea and phonation normal.  Cardiovascular:     Rate and Rhythm: Normal rate and regular rhythm.     Heart sounds: Normal heart sounds, S1 normal and S2 normal.  Pulmonary:     Effort: Pulmonary effort is normal. No respiratory distress.     Breath sounds: Normal breath sounds and air entry. No wheezing, rhonchi or  rales.  Chest:     Chest wall: No tenderness.  Musculoskeletal:     Cervical back: Neck supple.  Lymphadenopathy:     Cervical: No  cervical adenopathy.  Skin:    General: Skin is warm and dry.     Capillary Refill: Capillary refill takes less than 2 seconds.     Findings: No rash.  Neurological:     General: No focal deficit present.     Mental Status: She is alert and oriented to person, place, and time. Mental status is at baseline.     Cranial Nerves: No dysarthria or facial asymmetry.  Psychiatric:        Mood and Affect: Mood normal.        Speech: Speech normal.        Behavior: Behavior normal.        Thought Content: Thought content normal.        Judgment: Judgment normal.      UC Treatments / Results  Labs (all labs ordered are listed, but only abnormal results are displayed) Labs Reviewed - No data to display  EKG   Radiology No results found.  Procedures Procedures (including critical care time)  Medications Ordered in UC Medications - No data to display  Initial Impression / Assessment and Plan / UC Course  I have reviewed the triage vital signs and the nursing notes.  Pertinent labs & imaging results that were available during my care of the patient were reviewed by me and considered in my medical decision making (see chart for details).   1. Viral URI with cough Suspect viral URI, viral syndrome.  Strep/viral testing: deferred given timing of illness.   Physical exam findings reassuring, vital signs hemodynamically stable, and lungs clear, therefore deferred imaging of the chest.  Advised supportive care/prescriptions for symptomatic relief as outlined in AVS.    Counseled patient on potential for adverse effects with medications prescribed/recommended today, strict ER and return-to-clinic precautions discussed, patient verbalized understanding.    Final Clinical Impressions(s) / UC Diagnoses   Final diagnoses:  Viral URI with cough      Discharge Instructions      You have a viral illness which will improve on its own with rest, fluids, and medications to help with your symptoms.  Tylenol, guaifenesin (plain mucinex), and saline nasal sprays may help relieve symptoms.  Promethazine DM at bedtime as needed for cough.   Two teaspoons of honey in 1 cup of warm water every 4-6 hours may help with throat pains.  Humidifier in room at nighttime may help soothe cough (clean well daily).   For chest pain, shortness of breath, inability to keep food or fluids down without vomiting, fever that does not respond to tylenol or motrin, or any other severe symptoms, please go to the ER for further evaluation. Return to urgent care as needed, otherwise follow-up with PCP.      ED Prescriptions     Medication Sig Dispense Auth. Provider   Guaifenesin 1200 MG TB12 Take 1 tablet (1,200 mg total) by mouth in the morning and at bedtime. 14 tablet Carlisle Beers, FNP   promethazine-dextromethorphan (PROMETHAZINE-DM) 6.25-15 MG/5ML syrup Take 5 mLs by mouth at bedtime as needed for cough. 118 mL Carlisle Beers, FNP      PDMP not reviewed this encounter.   Reita May [place], [place] 01/30/24 [phone]

## 2024-01-30 NOTE — Discharge Instructions (Signed)
 You have a viral illness which will improve on its own with rest, fluids, and medications to help with your symptoms.  Tylenol, guaifenesin (plain mucinex), and saline nasal sprays may help relieve symptoms.  Promethazine DM at bedtime as needed for cough.  Two teaspoons of honey in 1 cup of warm water every 4-6 hours may help with throat pains.  Humidifier in room at nighttime may help soothe cough (clean well daily).   For chest pain, shortness of breath, inability to keep food or fluids down without vomiting, fever that does not respond to tylenol or motrin, or any other severe symptoms, please go to the ER for further evaluation. Return to urgent care as needed, otherwise follow-up with PCP.

## 2024-02-14 ENCOUNTER — Encounter: Payer: Self-pay | Admitting: Nurse Practitioner

## 2024-02-15 ENCOUNTER — Telehealth (INDEPENDENT_AMBULATORY_CARE_PROVIDER_SITE_OTHER): Payer: Self-pay | Admitting: Otolaryngology

## 2024-02-15 NOTE — Telephone Encounter (Signed)
 Confirmed appt & location [id number] afm

## 2024-02-16 ENCOUNTER — Ambulatory Visit (INDEPENDENT_AMBULATORY_CARE_PROVIDER_SITE_OTHER): Payer: No Typology Code available for payment source | Admitting: Otolaryngology

## 2024-02-16 VITALS — BP 126/76 | HR 67 | Ht 63.0 in | Wt 187.0 lb

## 2024-02-16 DIAGNOSIS — H608X3 Other otitis externa, bilateral: Secondary | ICD-10-CM

## 2024-02-16 MED ORDER — MOMETASONE FUROATE 0.1 % EX CREA
TOPICAL_CREAM | CUTANEOUS | 3 refills | Status: AC
Start: 2024-02-16 — End: ?

## 2024-02-19 DIAGNOSIS — H608X3 Other otitis externa, bilateral: Secondary | ICD-10-CM | POA: Insufficient documentation

## 2024-02-19 NOTE — Progress Notes (Signed)
 Patient ID: Tammy Ali, female   DOB: 04-Dec-1971, 53 y.o.   MRN: [id number]  New complaint: Bilateral itchy ears  HPI: The patient is a 53 year old female who presents today with a new complaint of itchy sensation in the ears for the past 6 months.  She denies any significant otalgia, otorrhea, or vertigo.  She was previously seen for left ear tinnitus.  Her hearing was normal bilaterally.  She denies any recent change in her hearing.  She is not on any otologic medication.  Exam: General: Communicates without difficulty, well nourished, no acute distress. Head: Normocephalic, no evidence injury, no tenderness, facial buttresses intact without stepoff. Face/sinus: No tenderness to palpation and percussion. Facial movement is normal and symmetric. Eyes: PERRL, EOMI. No scleral icterus, conjunctivae clear. Neuro: CN II exam reveals vision grossly intact.  No nystagmus at any point of gaze. Ears: Auricles well formed without lesions.  Ear canals are intact with eczematous changes bilaterally.  The TMs are intact without fluid. Nose: External evaluation reveals normal support and skin without lesions.  Dorsum is intact.  Anterior rhinoscopy reveals congested mucosa over anterior aspect of inferior turbinates and intact septum.  No purulence noted. Oral:  Oral cavity and oropharynx are intact, symmetric, without erythema or edema.  Mucosa is moist without lesions. Neck: Full range of motion without pain.  There is no significant lymphadenopathy.  No masses palpable.  Thyroid bed within normal limits to palpation.  Parotid glands and submandibular glands equal bilaterally without mass.  Trachea is midline. Neuro:  CN 2-12 grossly intact.   Assessment: 1.  Bilateral chronic eczematous otitis externa. 2.  Her tympanic membranes and middle ear spaces are noted to be normal.  Plan: 1.  The physical exam findings are reviewed with the patient. 2.  Elocon cream to treat the chronic eczematous otitis externa. 3.   The patient is encouraged to call with any questions or concerns.

## 2024-03-06 NOTE — Telephone Encounter (Signed)
 Copied from CRM [id number]. Topic: General - Billing Inquiry >> Mar 02, 2024  3:20 PM Carlatta H wrote: Reason for CRM: Please call [phone] invoice# [id number]//Needs insurance information for dates of service

## 2024-03-08 NOTE — Telephone Encounter (Signed)
 Ref # [id number] Called [facility] and gave them the [employer] insurance information.

## 2024-03-28 ENCOUNTER — Ambulatory Visit: Payer: No Typology Code available for payment source | Admitting: Family

## 2024-04-04 ENCOUNTER — Ambulatory Visit: Admitting: Nurse Practitioner

## 2024-04-04 NOTE — Progress Notes (Deleted)
 04/04/2024 Tirza Senteno [id number] [birth year]   CHIEF COMPLAINT:   HISTORY OF PRESENT ILLNESS: Dore Carthen is a 53 year old female with a past medical history of chronic microcytic anemia, gestational diabetes, hepatitis B carrier.  She presents to our office today as referred by Lavona Pounds NP for further evaluation regarding abdominal pain.      Latest Ref Rng & Units 12/28/2023   10:25 AM 04/14/2022    9:02 AM 01/19/2022   11:34 AM  CBC  WBC 3.4 - 10.8 x10E3/uL 6.9  5.3  6.3   Hemoglobin 11.1 - 15.9 g/dL 14.7  82.9  56.2   Hematocrit 34.0 - 46.6 % 40.3  41.5  38.0   Platelets 150 - 450 x10E3/uL 274  267  266    MCV 61     Latest Ref Rng & Units 12/28/2023   10:25 AM 10/05/2023    2:15 PM 09/07/2023   10:03 AM  CMP  Glucose 70 - 99 mg/dL 130  98  97   BUN 6 - 24 mg/dL 13  17  12    Creatinine 0.57 - 1.00 mg/dL 8.65  7.84  6.96   Sodium 134 - 144 mmol/L 142  140  142   Potassium 3.5 - 5.2 mmol/L 4.8  4.3  4.6   Chloride 96 - 106 mmol/L 106  103  105   CO2 20 - 29 mmol/L 24  20  24    Calcium  8.7 - 10.2 mg/dL 8.9  9.0  9.0   Total Protein 6.0 - 8.5 g/dL 7.3  7.2    Total Bilirubin 0.0 - 1.2 mg/dL 0.3  <2.9    Alkaline Phos 44 - 121 IU/L 120  97    AST 0 - 40 IU/L 23  22    ALT 0 - 32 IU/L 25  22      RUQ sono 02/04/2016:  Gallbladder: No gallstones or wall thickening visualized. No sonographic Murphy sign noted by sonographer.   Common bile duct: Diameter: 4 mm, within normal limits.   Liver: No focal lesion identified. Within normal limits in parenchymal echogenicity. Portal vein is patent with normal hepatopetal flow direction.   IMPRESSION: Negative. No or other hepatobiliary abnormality identified sonographically.    Past Medical History:  Diagnosis Date   Anemia    GDM (gestational diabetes mellitus)    Hepatitis B carrier (HCC)    Past Surgical History:  Procedure Laterality Date   BRAIN SURGERY     ?crainiotomy ?shunt placement- for headaches    Social History:  Family History:    reports that she has never smoked. She has never been exposed to tobacco smoke. She has never used smokeless tobacco. She reports that she does not drink alcohol and does not use drugs. family history is not on file. She was adopted. No Known Allergies    Outpatient Encounter Medications as of 04/04/2024  Medication Sig   acetaminophen  (TYLENOL ) 500 MG tablet Take 500 mg by mouth every 6 (six) hours as needed.   atorvastatin  (LIPITOR) 20 MG tablet Take 1 tablet (20 mg total) by mouth daily.   Guaifenesin  1200 MG TB12 Take 1 tablet (1,200 mg total) by mouth in the morning and at bedtime.   JARDIANCE  10 MG TABS tablet TAKE 1 TABLET BY MOUTH DAILY BEFORE BREAKFAST.   metFORMIN  (GLUCOPHAGE ) 500 MG tablet Take 500 mg by mouth 2 (two) times daily.   mometasone  (ELOCON ) 0.1 % cream Apply topically daily as needed for  itch.   polyethylene glycol powder (GLYCOLAX /MIRALAX ) 17 GM/SCOOP powder Take 17 g by mouth daily as needed.   promethazine -dextromethorphan (PROMETHAZINE -DM) 6.25-15 MG/5ML syrup Take 5 mLs by mouth at bedtime as needed for cough.   sitaGLIPtin  (JANUVIA ) 25 MG tablet Take 1 tablet (25 mg total) by mouth daily.   No facility-administered encounter medications on file as of 04/04/2024.   REVIEW OF SYSTEMS:  Gen: Denies fever, sweats or chills. No weight loss.  CV: Denies chest pain, palpitations or edema. Resp: Denies cough, shortness of breath of hemoptysis.  GI: Denies heartburn, dysphagia, stomach or lower abdominal pain. No diarrhea or constipation.  GU: Denies urinary burning, blood in urine, increased urinary frequency or incontinence. MS: Denies joint pain, muscles aches or weakness. Derm: Denies rash, itchiness, skin lesions or unhealing ulcers. Psych: Denies depression, anxiety, memory loss or confusion. Heme: Denies bruising, easy bleeding. Neuro:  Denies headaches, dizziness or paresthesias. Endo:  Denies any problems with DM,  thyroid or adrenal function.  PHYSICAL EXAM: There were no vitals taken for this visit. General: in no acute distress. Head: Normocephalic and atraumatic. Eyes:  Sclerae non-icteric, conjunctive pink. Ears: Normal auditory acuity. Mouth: Dentition intact. No ulcers or lesions.  Neck: Supple, no lymphadenopathy or thyromegaly.  Lungs: Clear bilaterally to auscultation without wheezes, crackles or rhonchi. Heart: Regular rate and rhythm. No murmur, rub or gallop appreciated.  Abdomen: Soft, nontender, nondistended. No masses. No hepatosplenomegaly. Normoactive bowel sounds x 4 quadrants.  Rectal: Deferred.  Musculoskeletal: Symmetrical with no gross deformities. Skin: Warm and dry. No rash or lesions on visible extremities. Extremities: No edema. Neurological: Alert oriented x 4, no focal deficits.  Psychological:  Alert and cooperative. Normal mood and affect.  ASSESSMENT AND PLAN:  53 year old female with abdominal pain -EGD and colonoscopy   Chronic microcytic anemia  -CBC, IBC + ferritin, B12 and folate     CC:  Senaida Dama, NP

## 2024-04-23 ENCOUNTER — Emergency Department (HOSPITAL_COMMUNITY): Admission: EM | Admit: 2024-04-23 | Discharge: 2024-04-23 | Disposition: A | Discharge status: Home / Self Care

## 2024-04-23 ENCOUNTER — Emergency Department (HOSPITAL_COMMUNITY)

## 2024-04-23 ENCOUNTER — Other Ambulatory Visit: Payer: Self-pay

## 2024-04-23 DIAGNOSIS — E119 Type 2 diabetes mellitus without complications: Secondary | ICD-10-CM | POA: Insufficient documentation

## 2024-04-23 DIAGNOSIS — S161XXA Strain of muscle, fascia and tendon at neck level, initial encounter: Secondary | ICD-10-CM | POA: Insufficient documentation

## 2024-04-23 DIAGNOSIS — Z23 Encounter for immunization: Secondary | ICD-10-CM | POA: Diagnosis not present

## 2024-04-23 DIAGNOSIS — R519 Headache, unspecified: Secondary | ICD-10-CM | POA: Diagnosis not present

## 2024-04-23 DIAGNOSIS — M542 Cervicalgia: Secondary | ICD-10-CM | POA: Diagnosis present

## 2024-04-23 DIAGNOSIS — R1033 Periumbilical pain: Secondary | ICD-10-CM | POA: Diagnosis not present

## 2024-04-23 DIAGNOSIS — R1011 Right upper quadrant pain: Secondary | ICD-10-CM | POA: Insufficient documentation

## 2024-04-23 DIAGNOSIS — Y92410 Unspecified street and highway as the place of occurrence of the external cause: Secondary | ICD-10-CM | POA: Insufficient documentation

## 2024-04-23 DIAGNOSIS — D649 Anemia, unspecified: Secondary | ICD-10-CM | POA: Insufficient documentation

## 2024-04-23 DIAGNOSIS — Z7984 Long term (current) use of oral hypoglycemic drugs: Secondary | ICD-10-CM | POA: Insufficient documentation

## 2024-04-23 LAB — BASIC METABOLIC PANEL WITH GFR
Anion gap: 8 (ref 5–15)
BUN: 12 mg/dL (ref 6–20)
CO2: 26 mmol/L (ref 22–32)
Calcium: 9.1 mg/dL (ref 8.9–10.3)
Chloride: 106 mmol/L (ref 98–111)
Creatinine, Ser: 0.72 mg/dL (ref 0.44–1.00)
GFR, Estimated: >60 mL/min (ref >60)
Glucose, Bld: 113 mg/dL — ABNORMAL HIGH (ref 70–99)
Potassium: 4 mmol/L (ref 3.5–5.1)
Sodium: 140 mmol/L (ref 135–145)

## 2024-04-23 LAB — CBC
HCT: 40.8 % (ref 36.0–46.0)
Hemoglobin: 11.2 g/dL — ABNORMAL LOW (ref 12.0–15.0)
MCH: 16 pg — ABNORMAL LOW (ref 26.0–34.0)
MCHC: 27.5 g/dL — ABNORMAL LOW (ref 30.0–36.0)
MCV: 58.4 fL — ABNORMAL LOW (ref 80.0–100.0)
Platelets: 200 10*3/uL (ref 150–400)
RBC: 6.99 MIL/uL — ABNORMAL HIGH (ref 3.87–5.11)
RDW: 21.9 % — ABNORMAL HIGH (ref 11.5–15.5)
WBC: 3.9 10*3/uL — ABNORMAL LOW (ref 4.0–10.5)
nRBC: 0 % (ref 0.0–0.2)

## 2024-04-23 MED ORDER — METHOCARBAMOL 500 MG PO TABS: 500.0000 mg | ORAL_TABLET | Freq: Two times a day (BID) | ORAL | 0 refills | Status: AC

## 2024-04-23 MED ORDER — KETOROLAC TROMETHAMINE 15 MG/ML IJ SOLN
15.0000 mg | Freq: Once | INTRAMUSCULAR | Status: DC
  Filled 2024-04-23: qty 1

## 2024-04-23 MED ORDER — NAPROXEN 500 MG PO TABS: 500.0000 mg | ORAL_TABLET | Freq: Two times a day (BID) | ORAL | 0 refills | Status: DC

## 2024-04-23 MED ORDER — TETANUS-DIPHTH-ACELL PERTUSSIS 5-2.5-18.5 LF-MCG/0.5 IM SUSY
0.5000 mL | PREFILLED_SYRINGE | Freq: Once | INTRAMUSCULAR | Status: AC
  Administered 2024-04-23: 0.5 mL via INTRAMUSCULAR
  Filled 2024-04-23: qty 0.5

## 2024-04-23 MED ORDER — MORPHINE SULFATE (PF) 4 MG/ML IV SOLN
4.0000 mg | Freq: Once | INTRAVENOUS | Status: AC
  Administered 2024-04-23: 4 mg via INTRAVENOUS
  Filled 2024-04-23: qty 1

## 2024-04-23 MED ORDER — ONDANSETRON HCL 4 MG/2ML IJ SOLN
4.0000 mg | Freq: Once | INTRAMUSCULAR | Status: AC
  Administered 2024-04-23: 4 mg via INTRAVENOUS
  Filled 2024-04-23: qty 2

## 2024-04-23 MED ORDER — IOHEXOL 350 MG/ML SOLN
75.0000 mL | Freq: Once | INTRAVENOUS | Status: AC | PRN
  Administered 2024-04-23: 75 mL via INTRAVENOUS
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
## 2024-04-23 NOTE — ED Notes (Signed)
 Pt placed on bedpan
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
## 2024-04-23 NOTE — ED Notes (Signed)
 Ketorolac  placed on hold due to pt declining medication.

## 2024-04-23 NOTE — Discharge Instructions (Signed)
 Your workup today was reassuring.  Please take the naproxen twice daily as needed for pain.  You may take the Robaxin  for muscle pain/spasms.  Please follow-up with your doctor to have your blood counts rechecked as they were a little low today.  Return to the ER for worsening symptoms.

## 2024-04-23 NOTE — ED Notes (Signed)
 Family at bedside.

## 2024-04-26 ENCOUNTER — Encounter: Admitting: Family

## 2024-04-26 NOTE — Progress Notes (Signed)
 Erroneous encounter-disregard

## 2024-05-03 ENCOUNTER — Ambulatory Visit: Payer: Self-pay | Admitting: Family

## 2024-05-03 ENCOUNTER — Ambulatory Visit (INDEPENDENT_AMBULATORY_CARE_PROVIDER_SITE_OTHER): Admitting: Family

## 2024-05-03 VITALS — BP 136/87 | HR 62 | Wt 187.6 lb

## 2024-05-03 DIAGNOSIS — E785 Hyperlipidemia, unspecified: Secondary | ICD-10-CM | POA: Diagnosis not present

## 2024-05-03 DIAGNOSIS — E1165 Type 2 diabetes mellitus with hyperglycemia: Secondary | ICD-10-CM

## 2024-05-03 DIAGNOSIS — Z7984 Long term (current) use of oral hypoglycemic drugs: Secondary | ICD-10-CM | POA: Diagnosis not present

## 2024-05-03 DIAGNOSIS — Z603 Acculturation difficulty: Secondary | ICD-10-CM

## 2024-05-03 DIAGNOSIS — E119 Type 2 diabetes mellitus without complications: Secondary | ICD-10-CM

## 2024-05-03 LAB — POCT GLYCOSYLATED HEMOGLOBIN (HGB A1C): HbA1c, POC (controlled diabetic range): 6.8 % (ref 0.0–7.0)

## 2024-05-03 MED ORDER — ATORVASTATIN CALCIUM 20 MG PO TABS: 20.0000 mg | ORAL_TABLET | Freq: Every day | ORAL | 0 refills | Status: AC

## 2024-05-03 MED ORDER — SITAGLIPTIN PHOSPHATE 25 MG PO TABS: 25.0000 mg | ORAL_TABLET | Freq: Every day | ORAL | 0 refills | Status: AC
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
## 2024-05-08 ENCOUNTER — Ambulatory Visit
Admission: EM | Admit: 2024-05-08 | Discharge: 2024-05-08 | Disposition: A | Discharge status: Home / Self Care | Attending: Physician Assistant | Admitting: Physician Assistant

## 2024-05-08 ENCOUNTER — Other Ambulatory Visit: Payer: Self-pay

## 2024-05-08 DIAGNOSIS — S0083XD Contusion of other part of head, subsequent encounter: Secondary | ICD-10-CM

## 2024-05-08 DIAGNOSIS — G44319 Acute post-traumatic headache, not intractable: Secondary | ICD-10-CM | POA: Diagnosis not present

## 2024-05-08 MED ORDER — MELOXICAM 7.5 MG PO TABS: 7.5000 mg | ORAL_TABLET | Freq: Every day | ORAL | 0 refills | Status: AC

## 2024-05-08 MED ORDER — CYCLOBENZAPRINE HCL 10 MG PO TABS: 10.0000 mg | ORAL_TABLET | Freq: Two times a day (BID) | ORAL | 0 refills | Status: AC | PRN
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
## 2024-05-09 ENCOUNTER — Other Ambulatory Visit: Payer: Self-pay

## 2024-05-17 ENCOUNTER — Ambulatory Visit: Admitting: Family

## 2024-05-30 ENCOUNTER — Ambulatory Visit: Admitting: Gastroenterology

## 2024-05-30 ENCOUNTER — Encounter: Payer: Self-pay | Admitting: Gastroenterology

## 2024-05-30 VITALS — BP 116/80 | HR 56 | Ht 63.0 in | Wt 188.8 lb

## 2024-05-30 DIAGNOSIS — K219 Gastro-esophageal reflux disease without esophagitis: Secondary | ICD-10-CM

## 2024-05-30 DIAGNOSIS — Z1211 Encounter for screening for malignant neoplasm of colon: Secondary | ICD-10-CM | POA: Insufficient documentation

## 2024-05-30 DIAGNOSIS — R1011 Right upper quadrant pain: Secondary | ICD-10-CM

## 2024-05-30 DIAGNOSIS — K5909 Other constipation: Secondary | ICD-10-CM

## 2024-05-30 MED ORDER — PANTOPRAZOLE SODIUM 40 MG PO TBEC: 40.0000 mg | DELAYED_RELEASE_TABLET | Freq: Every day | ORAL | 3 refills | Status: AC

## 2024-05-30 MED ORDER — NA SULFATE-K SULFATE-MG SULF 17.5-3.13-1.6 GM/177ML PO SOLN: 1.0000 | Freq: Once | ORAL | 0 refills | Status: AC
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
## 2024-05-30 NOTE — Patient Instructions (Signed)
 Start Miralax  1 capful daily in 8 ounces of liquid.  You have been scheduled for an abdominal ultrasound at  [facility] (1st floor of hospital) on Thursday 06/01/24 at 1:30 pm. Please arrive 30 minutes prior to your appointment for registration. Make certain not to have anything to eat or drink 6 hours prior to your appointment. Should you need to reschedule your appointment, please contact radiology at [phone]. This test typically takes about 30 minutes to perform.  We have sent the following medications to your pharmacy for you to pick up at your convenience: Pantoprazole 40 mg daily 30-60 minutes before breakfast.   You have been scheduled for an endoscopy and colonoscopy. Please follow the written instructions given to you at your visit today.  If you use inhalers (even only as needed), please bring them with you on the day of your procedure.  DO NOT TAKE 7 DAYS PRIOR TO TEST- Trulicity (dulaglutide) Ozempic, Wegovy (semaglutide) Mounjaro (tirzepatide) Bydureon Bcise (exanatide extended release)  DO NOT TAKE 1 DAY PRIOR TO YOUR TEST Rybelsus (semaglutide) Adlyxin (lixisenatide) Victoza (liraglutide) Byetta (exanatide) ____________________________________________________________________  _______________________________________________________  If your blood pressure at your visit was 140/90 or greater, please contact your primary care physician to follow up on this.  _______________________________________________________  If you are age 54 or older, your body mass index should be between 23-30. Your Body mass index is 33.44 kg/m. If this is out of the aforementioned range listed, please consider follow up with your Primary Care Provider.  If you are age 64 or younger, your body mass index should be between 19-25. Your Body mass index is 33.44 kg/m. If this is out of the aformentioned range listed, please consider follow up with your Primary Care Provider.    ________________________________________________________  The [facility] providers would like to encourage you to use MYCHART to communicate with providers for non-urgent requests or questions.  Due to long hold times on the telephone, sending your provider a message by [facility] may be a faster and more efficient way to get a response.  Please allow 48 business hours for a response.  Please remember that this is for non-urgent requests.  _______________________________________________________

## 2024-06-01 ENCOUNTER — Ambulatory Visit (HOSPITAL_COMMUNITY): Admission: RE | Admit: 2024-06-01 | Source: Ambulatory Visit

## 2024-06-02 NOTE — Progress Notes (Signed)
 ____________________________________________________________  Attending physician addendum:  Thank you for sending this case to me. I have reviewed the entire note and agree with the plan.   Amada Jupiter, MD  ____________________________________________________________

## 2024-06-27 ENCOUNTER — Ambulatory Visit: Admitting: Family

## 2024-06-27 ENCOUNTER — Encounter: Payer: Self-pay | Admitting: Family

## 2024-06-27 VITALS — BP 109/73 | HR 66 | Temp 98.7°F | Resp 16 | Ht 63.0 in | Wt 190.0 lb

## 2024-06-27 DIAGNOSIS — R519 Headache, unspecified: Secondary | ICD-10-CM

## 2024-06-27 DIAGNOSIS — Z603 Acculturation difficulty: Secondary | ICD-10-CM

## 2024-06-27 DIAGNOSIS — Z758 Other problems related to medical facilities and other health care: Secondary | ICD-10-CM

## 2024-06-27 MED ORDER — SUMATRIPTAN SUCCINATE 25 MG PO TABS: ORAL_TABLET | ORAL | 0 refills | Status: AC

## 2024-06-27 NOTE — Progress Notes (Signed)
 Patient ID: Tammy Ali, female    DOB: February 04, 1971  MRN: [id number]  CC: Headache  Subjective: Tammy Ali is a 53 y.o. female who presents for headache.   Her concerns today include:  Intermittent left headache persisting. Denies red flag symptoms. Taking over-the-counter medication with minimal relief. She would like MRI of head.   Patient Active Problem List   Diagnosis Date Noted   RUQ abdominal pain 05/30/2024   Gastroesophageal reflux disease 05/30/2024   Screening for colon cancer 05/30/2024   Chronic eczematous otitis externa of both ears 02/19/2024   Hyperlipidemia 10/06/2023   Dyspareunia in female 08/27/2022   Abnormal laboratory test result 08/27/2022   Amenorrhea 08/27/2022   Blood in urine 08/27/2022   Chronic constipation 08/27/2022   Gestational diabetes mellitus 08/27/2022   Multigravida of advanced maternal age 67/14/2023   Pain in pelvis 08/27/2022   Pain of breast 08/27/2022   Pyuria 08/27/2022   Diabetes mellitus, type 2 (HCC) 01/20/2022   Active labor at term 06/13/2016   Chronic type B viral hepatitis (HCC) 03/23/2016     Current Outpatient Medications on File Prior to Visit  Medication Sig Dispense Refill   acetaminophen  (TYLENOL ) 500 MG tablet Take 500 mg by mouth every 6 (six) hours as needed. (Patient not taking: Reported on 06/27/2024)     atorvastatin  (LIPITOR) 20 MG tablet Take 1 tablet (20 mg total) by mouth daily. (Patient not taking: Reported on 05/30/2024) 90 tablet 0   cyclobenzaprine  (FLEXERIL ) 10 MG tablet Take 1 tablet (10 mg total) by mouth 2 (two) times daily as needed for muscle spasms. (Patient not taking: Reported on 05/30/2024) 20 tablet 0   Guaifenesin  1200 MG TB12 Take 1 tablet (1,200 mg total) by mouth in the morning and at bedtime. (Patient not taking: Reported on 05/30/2024) 14 tablet 0   JARDIANCE  10 MG TABS tablet TAKE 1 TABLET BY MOUTH DAILY BEFORE BREAKFAST. (Patient not taking: Reported on 05/30/2024) 30 tablet 1   meloxicam   (MOBIC ) 7.5 MG tablet Take 1 tablet (7.5 mg total) by mouth daily. (Patient not taking: Reported on 05/30/2024) 30 tablet 0   metFORMIN  (GLUCOPHAGE ) 500 MG tablet Take 500 mg by mouth 2 (two) times daily. (Patient not taking: Reported on 05/30/2024)     methocarbamol  (ROBAXIN ) 500 MG tablet Take 1 tablet (500 mg total) by mouth 2 (two) times daily. (Patient not taking: Reported on 05/30/2024) 20 tablet 0   mometasone  (ELOCON ) 0.1 % cream Apply topically daily as needed for itch. (Patient not taking: Reported on 05/30/2024) 15 g 3   naproxen  sodium (ALEVE ) 220 MG tablet Take 220 mg by mouth daily as needed. (Patient not taking: Reported on 06/27/2024)     pantoprazole  (PROTONIX ) 40 MG tablet Take 1 tablet (40 mg total) by mouth daily. (Patient not taking: Reported on 06/27/2024) 90 tablet 3   polyethylene glycol powder (GLYCOLAX /MIRALAX ) 17 GM/SCOOP powder Take 17 g by mouth daily as needed. (Patient not taking: Reported on 05/30/2024) 3350 g 0   promethazine -dextromethorphan (PROMETHAZINE -DM) 6.25-15 MG/5ML syrup Take 5 mLs by mouth at bedtime as needed for cough. (Patient not taking: Reported on 05/30/2024) 118 mL 0   sitaGLIPtin  (JANUVIA ) 25 MG tablet Take 1 tablet (25 mg total) by mouth daily. (Patient not taking: Reported on 05/30/2024) 90 tablet 0   No current facility-administered medications on file prior to visit.    No Known Allergies  Social History   Socioeconomic History   Marital status: Legally Separated    Spouse  name: Not on file   Number of children: 6   Years of education: Not on file   Highest education level: Not on file  Occupational History   Not on file  Tobacco Use   Smoking status: Never    Passive exposure: Never   Smokeless tobacco: Never  Vaping Use   Vaping status: Never Used  Substance and Sexual Activity   Alcohol use: No   Drug use: No   Sexual activity: Yes    Birth control/protection: None  Other Topics Concern   Not on file  Social History Narrative    ** Merged History Encounter **       Social Drivers of Health   Financial Resource Strain: Low Risk  (11/02/2023)   Overall Financial Resource Strain (CARDIA)    Difficulty of Paying Living Expenses: Not hard at all  Food Insecurity: No Food Insecurity (11/02/2023)   Hunger Vital Sign    Worried About Running Out of Food in the Last Year: Never true    Ran Out of Food in the Last Year: Never true  Transportation Needs: No Transportation Needs (11/02/2023)   PRAPARE - Administrator, Civil Service (Medical): No    Lack of Transportation (Non-Medical): No  Physical Activity: Inactive (11/02/2023)   Exercise Vital Sign    Days of Exercise per Week: 0 days    Minutes of Exercise per Session: 0 min  Stress: No Stress Concern Present (11/02/2023)   [employer] of Occupational Health - Occupational Stress Questionnaire    Feeling of Stress : Not at all  Social Connections: Not on file  Intimate Partner Violence: Not At Risk (11/02/2023)   Humiliation, Afraid, Rape, and Kick questionnaire    Fear of Current or Ex-Partner: No    Emotionally Abused: No    Physically Abused: No    Sexually Abused: No    Family History  Adopted: Yes    Past Surgical History:  Procedure Laterality Date   BRAIN SURGERY     ?crainiotomy ?shunt placement- for headaches    ROS: Review of Systems Negative except as stated above  PHYSICAL EXAM: BP 109/73   Pulse 66   Temp 98.7 F (37.1 C) (Oral)   Resp 16   Ht 5' 3 (1.6 m)   Wt 190 lb (86.2 kg)   LMP 05/28/2024 (Approximate)   SpO2 95%   BMI 33.66 kg/m   Physical Exam HENT:     Head: Normocephalic and atraumatic.     Right Ear: Tympanic membrane, ear canal and external ear normal.     Left Ear: Tympanic membrane, ear canal and external ear normal.     Nose: Nose normal.     Mouth/Throat:     Mouth: Mucous membranes are moist.     Pharynx: Oropharynx is clear.  Eyes:     Extraocular Movements: Extraocular movements  intact.     Conjunctiva/sclera: Conjunctivae normal.     Pupils: Pupils are equal, round, and reactive to light.  Cardiovascular:     Rate and Rhythm: Normal rate and regular rhythm.     Pulses: Normal pulses.     Heart sounds: Normal heart sounds.  Pulmonary:     Effort: Pulmonary effort is normal.     Breath sounds: Normal breath sounds.  Musculoskeletal:        General: Normal range of motion.     Cervical back: Normal range of motion and neck supple.  Neurological:     General:  No focal deficit present.     Mental Status: She is alert and oriented to person, place, and time.  Psychiatric:        Mood and Affect: Mood normal.        Behavior: Behavior normal.     ASSESSMENT AND PLAN: 1. Nonintractable headache, unspecified chronicity pattern, unspecified headache type (Primary) - Sumatriptan  as prescribed. Counseled on medication adherence/adverse effects.  - MR Brain for evaluation.  - Follow-up with primary provider as scheduled. - MR Brain Wo Contrast; Future - SUMAtriptan  (IMITREX ) 25 MG tablet; Take 25 mg (1 tablet total) by mouth at the start of the headache. May repeat in 2 hours x 1 if headache persists. Max of 2 tablets/24 hours.  Dispense: 90 tablet; Refill: 0  2. Language barrier - Patient speaking English.   Patient was given the opportunity to ask questions.  Patient verbalized understanding of the plan and was able to repeat key elements of the plan. Patient was given clear instructions to go to Emergency Department or return to medical center if symptoms don't improve, worsen, or new problems develop.The patient verbalized understanding.   Orders Placed This Encounter  Procedures   MR Brain Wo Contrast     Requested Prescriptions   Signed Prescriptions Disp Refills   SUMAtriptan  (IMITREX ) 25 MG tablet 90 tablet 0    Sig: Take 25 mg (1 tablet total) by mouth at the start of the headache. May repeat in 2 hours x 1 if headache persists. Max of 2  tablets/24 hours.    Follow-up with primary provider as scheduled.  Greig JINNY Drones, NP

## 2024-06-27 NOTE — Progress Notes (Signed)
 Patient is having headaches and wants a MRI

## 2024-07-05 ENCOUNTER — Encounter: Payer: Self-pay | Admitting: Family

## 2024-07-11 ENCOUNTER — Other Ambulatory Visit (HOSPITAL_COMMUNITY)

## 2024-07-11 ENCOUNTER — Ambulatory Visit (HOSPITAL_COMMUNITY)
Admission: RE | Admit: 2024-07-11 | Discharge: 2024-07-11 | Disposition: A | Discharge status: Home / Self Care | Source: Ambulatory Visit | Attending: Gastroenterology | Admitting: Gastroenterology

## 2024-07-11 DIAGNOSIS — R1011 Right upper quadrant pain: Secondary | ICD-10-CM | POA: Insufficient documentation

## 2024-07-11 DIAGNOSIS — K219 Gastro-esophageal reflux disease without esophagitis: Secondary | ICD-10-CM | POA: Insufficient documentation

## 2024-07-17 ENCOUNTER — Ambulatory Visit: Payer: Self-pay | Admitting: Physician Assistant

## 2024-07-18 ENCOUNTER — Encounter: Admitting: Gastroenterology

## 2024-07-25 ENCOUNTER — Ambulatory Visit
Admission: RE | Admit: 2024-07-25 | Discharge: 2024-07-25 | Disposition: A | Discharge status: Home / Self Care | Source: Ambulatory Visit | Attending: Family | Admitting: Family

## 2024-07-25 DIAGNOSIS — R519 Headache, unspecified: Secondary | ICD-10-CM

## 2024-08-02 ENCOUNTER — Ambulatory Visit: Payer: Self-pay | Admitting: Family

## 2024-08-02 DIAGNOSIS — R519 Headache, unspecified: Secondary | ICD-10-CM

## 2025-01-16 ENCOUNTER — Ambulatory Visit: Payer: No Typology Code available for payment source | Admitting: Podiatry

## 2025-01-30 ENCOUNTER — Ambulatory Visit: Admitting: Neurology
